# Patient Record
Sex: Female | Born: 1975 | Race: White | Hispanic: Yes | Marital: Married | Smoking: Former smoker
Health system: Southern US, Community
[De-identification: ages and names within clinical notes are randomized; demographics above are authoritative.]

## PROBLEM LIST (undated history)

## (undated) DIAGNOSIS — N920 Excessive and frequent menstruation with regular cycle: Secondary | ICD-10-CM

## (undated) DIAGNOSIS — M069 Rheumatoid arthritis, unspecified: Secondary | ICD-10-CM

## (undated) HISTORY — DX: Rheumatoid arthritis, unspecified: M06.9

## (undated) HISTORY — DX: Excessive and frequent menstruation with regular cycle: N92.0

## (undated) HISTORY — PX: APPENDECTOMY: SHX54

---

## 2010-05-12 ENCOUNTER — Ambulatory Visit (HOSPITAL_COMMUNITY): Admission: RE | Admit: 2010-05-12 | Discharge: 2010-05-12 | Payer: Self-pay | Admitting: Obstetrics and Gynecology

## 2010-11-07 ENCOUNTER — Encounter: Payer: Self-pay | Admitting: Obstetrics and Gynecology

## 2011-07-08 IMAGING — US US OB DETAIL+14 WK
1 series · 18 of 28 positions shown · non-contrast
Comparison: none

OBSTETRICAL ULTRASOUND:
 This ultrasound was performed in The [HOSPITAL], and the [facility] report will be stored to [REDACTED] PACS.  This report is also available in [HOSPITAL]?s accessANYware.

[Series 1: us ob detail+14 wk · 18 of 41 slices shown]
[im 1/41]
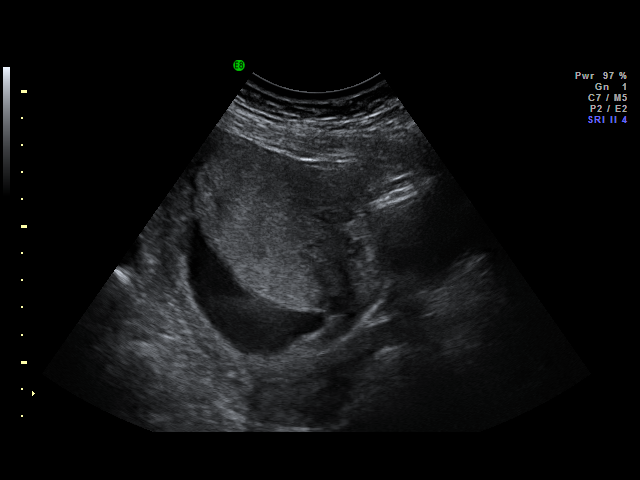
[im 3/41]
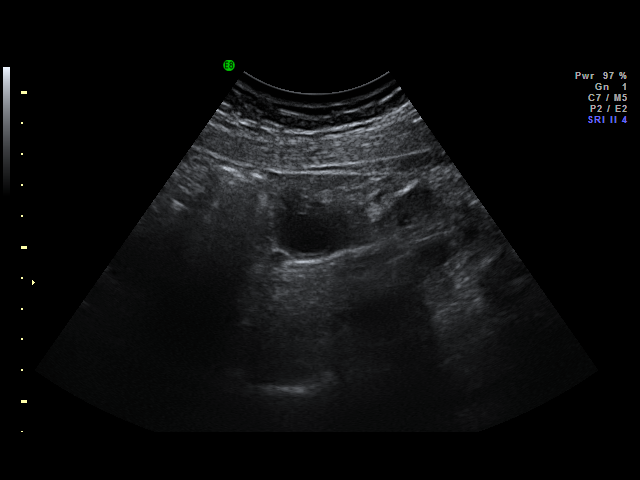
[im 5/41]
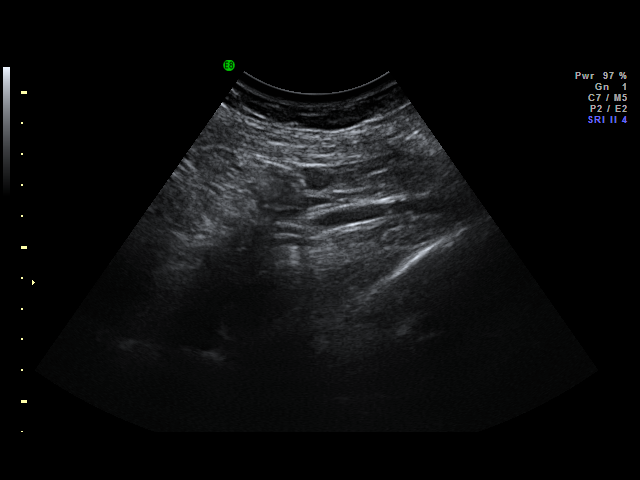
[im 8/41]
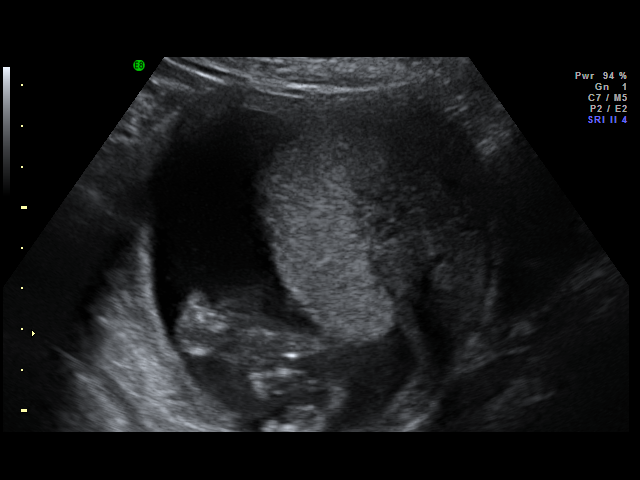
[im 11/41]
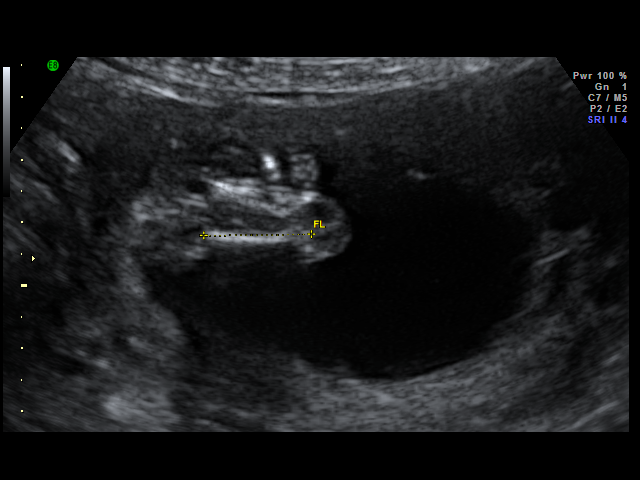
[im 12/41]
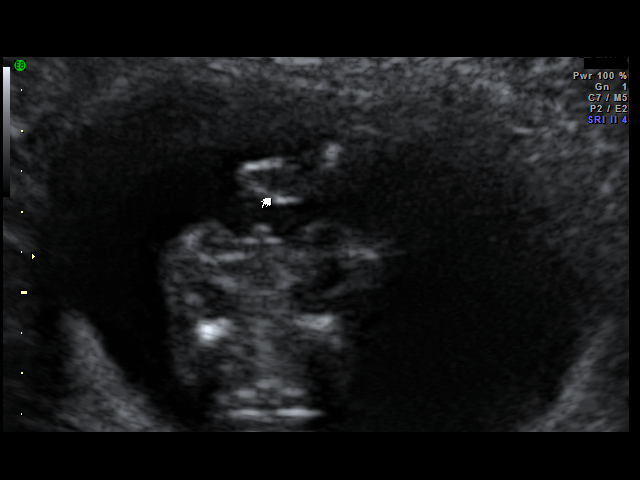
[im 15/41]
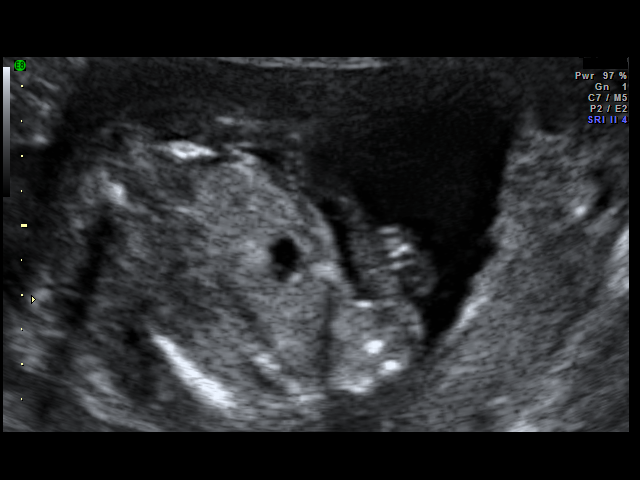
[im 17/41]
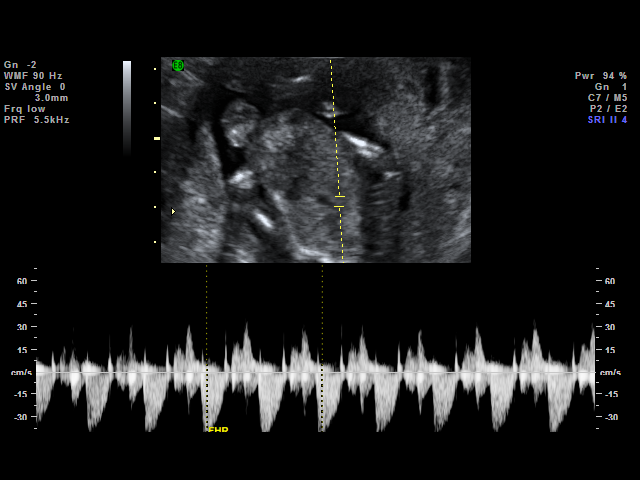
[im 20/41]
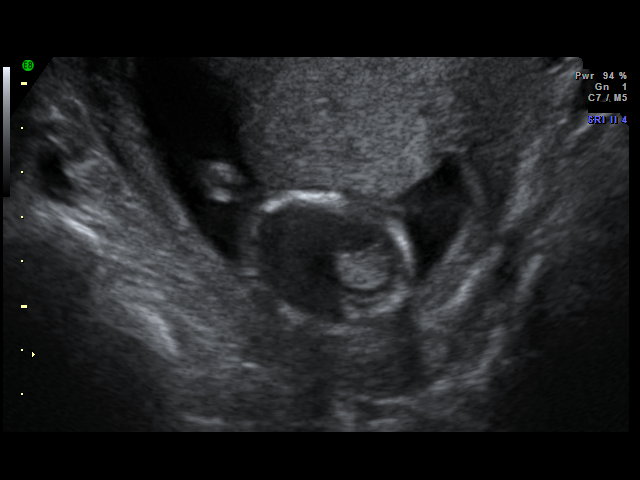
[im 21/41]
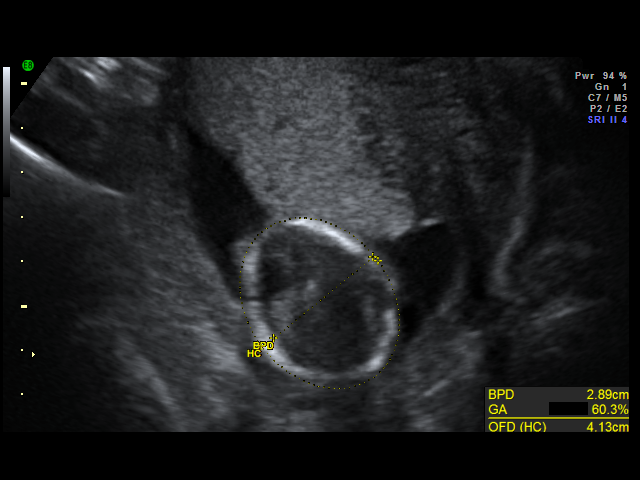
[im 24/41]
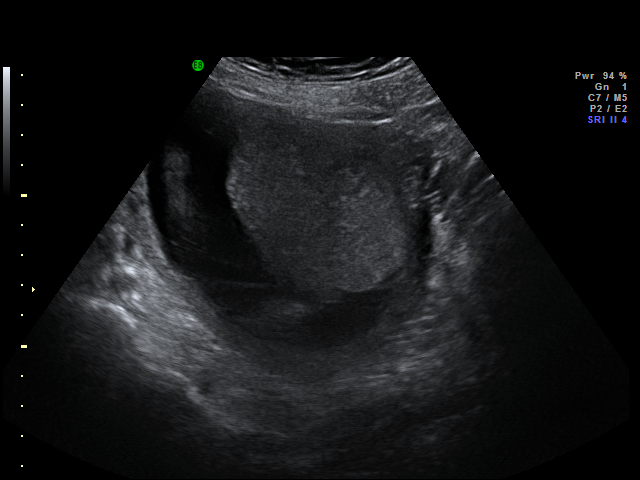
[im 26/41]
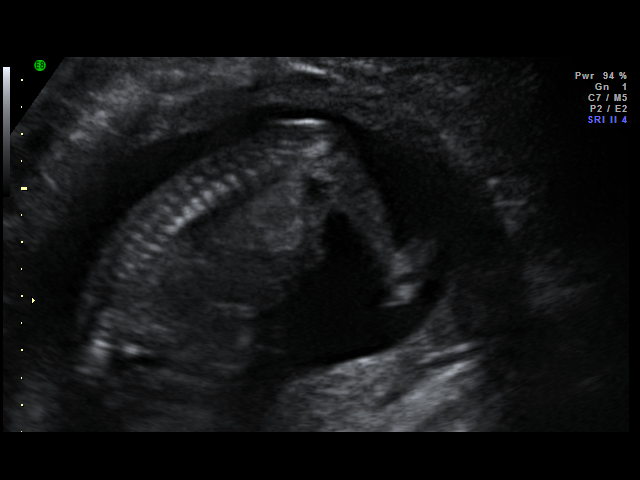
[im 29/41]
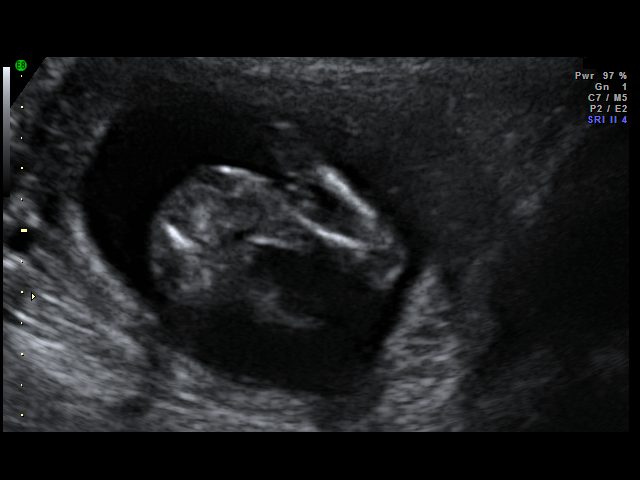
[im 32/41]
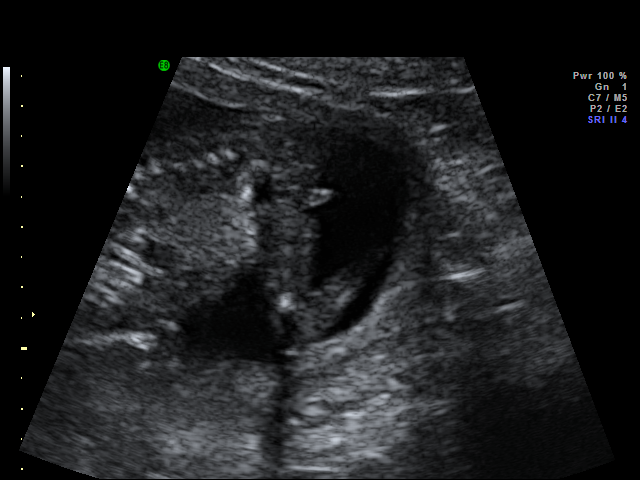
[im 33/41]
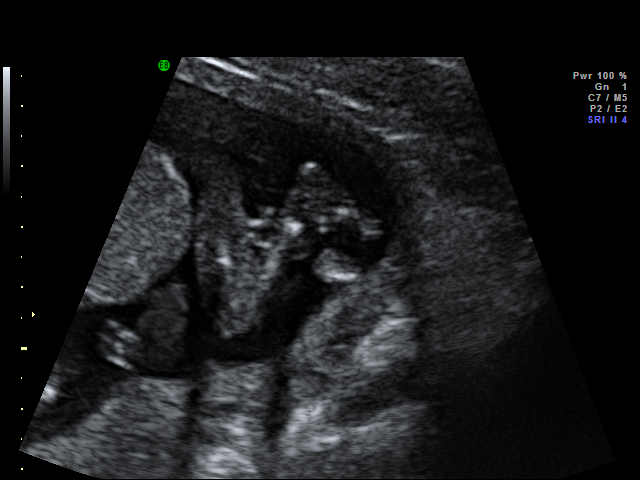
[im 36/41]
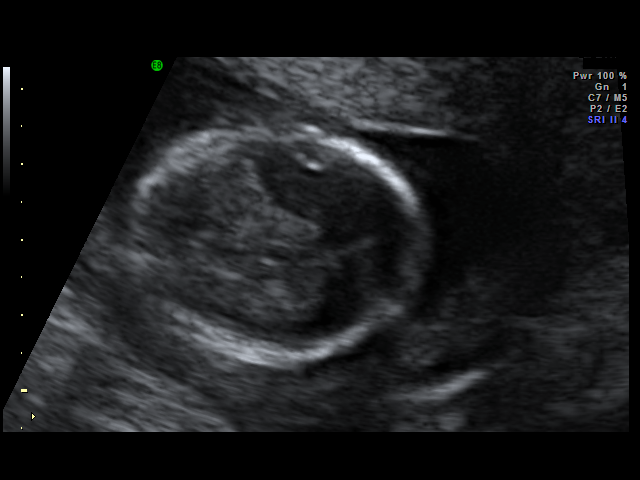
[im 38/41]
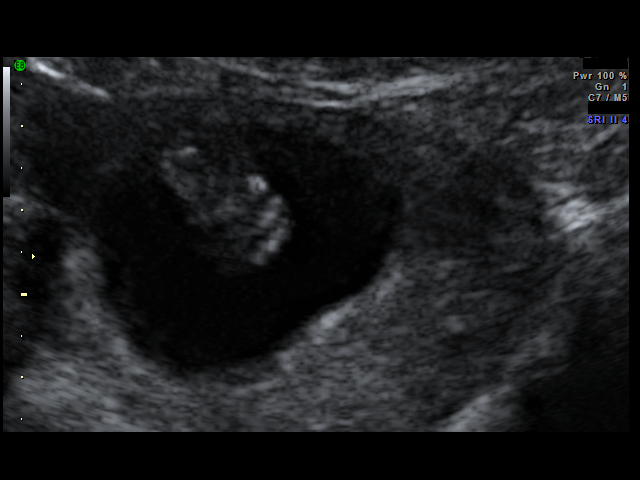
[im 41/41]
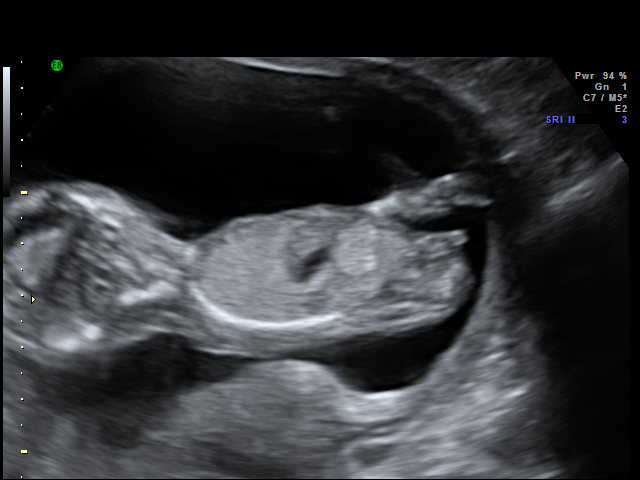

[18 of 28 positions shown; findings below may reference images not displayed]

IMPRESSION: [facility] has also been faxed to the ordering physician.

## 2012-08-29 ENCOUNTER — Ambulatory Visit (INDEPENDENT_AMBULATORY_CARE_PROVIDER_SITE_OTHER): Payer: Self-pay | Admitting: Obstetrics & Gynecology

## 2012-08-29 ENCOUNTER — Encounter: Payer: Self-pay | Admitting: Advanced Practice Midwife

## 2012-08-29 ENCOUNTER — Encounter: Payer: Self-pay | Admitting: Obstetrics & Gynecology

## 2012-08-29 ENCOUNTER — Other Ambulatory Visit (HOSPITAL_COMMUNITY)
Admission: RE | Admit: 2012-08-29 | Discharge: 2012-08-29 | Disposition: A | Discharge status: Home / Self Care | Payer: Self-pay | Source: Ambulatory Visit | Attending: Obstetrics & Gynecology | Admitting: Obstetrics & Gynecology

## 2012-08-29 VITALS — BP 112/78 | HR 88 | Temp 98.0°F | Ht 64.0 in | Wt 178.4 lb

## 2012-08-29 DIAGNOSIS — Z01812 Encounter for preprocedural laboratory examination: Secondary | ICD-10-CM

## 2012-08-29 DIAGNOSIS — N8500 Endometrial hyperplasia, unspecified: Secondary | ICD-10-CM | POA: Insufficient documentation

## 2012-08-29 DIAGNOSIS — M069 Rheumatoid arthritis, unspecified: Secondary | ICD-10-CM | POA: Insufficient documentation

## 2012-08-29 DIAGNOSIS — N920 Excessive and frequent menstruation with regular cycle: Secondary | ICD-10-CM

## 2012-08-29 LAB — POCT PREGNANCY, URINE: Preg Test, Ur: NEGATIVE

## 2012-08-29 MED ORDER — NORGESTIMATE-ETH ESTRADIOL 0.25-35 MG-MCG PO TABS: 1.0000 | ORAL_TABLET | Freq: Every day | ORAL | Status: DC

## 2012-08-29 NOTE — Patient Instructions (Signed)
Dysfunctional Uterine Bleeding Normally, menstrual periods begin between ages 11 to 17 in young women. A normal menstrual cycle/period may begin every 23 days up to 35 days and lasts from 1 to 7 days. Around 12 to 14 days before your menstrual period starts, ovulation (ovary produces an egg) occurs. When counting the time between menstrual periods, count from the first day of bleeding of the previous period to the first day of bleeding of the next period. Dysfunctional (abnormal) uterine bleeding is bleeding that is different from a normal menstrual period. Your periods may come earlier or later than usual. They may be lighter, have blood clots or be heavier. You may have bleeding between periods, or you may skip one period or more. You may have bleeding after sexual intercourse, bleeding after menopause, or no menstrual period. CAUSES   Pregnancy (normal, miscarriage, tubal).  IUDs (intrauterine device, birth control).  Birth control pills.  Hormone treatment.  Menopause.  Infection of the cervix.  Blood clotting problems.  Infection of the inside lining of the uterus.  Endometriosis, inside lining of the uterus growing in the pelvis and other female organs.  Adhesions (scar tissue) inside the uterus.  Obesity or severe weight loss.  Uterine polyps inside the uterus.  Cancer of the vagina, cervix, or uterus.  Ovarian cysts or polycystic ovary syndrome.  Medical problems (diabetes, thyroid disease).  Uterine fibroids (noncancerous tumor).  Problems with your female hormones.  Endometrial hyperplasia, very thick lining and enlarged cells inside of the uterus.  Medicines that interfere with ovulation.  Radiation to the pelvis or abdomen.  Chemotherapy. DIAGNOSIS   Your doctor will discuss the history of your menstrual periods, medicines you are taking, changes in your weight, stress in your life, and any medical problems you may have.  Your doctor will do a physical  and pelvic examination.  Your doctor may want to perform certain tests to make a diagnosis, such as:  Pap test.  Blood tests.  Cultures for infection.  CT scan.  Ultrasound.  Hysteroscopy.  Laparoscopy.  MRI.  Hysterosalpingography.  D and C.  Endometrial biopsy. TREATMENT  Treatment will depend on the cause of the dysfunctional uterine bleeding (DUB). Treatment may include:  Observing your menstrual periods for a couple of months.  Prescribing medicines for medical problems, including:  Antibiotics.  Hormones.  Birth control pills.  Removing an IUD (intrauterine device, birth control).  Surgery:  D and C (scrape and remove tissue from inside the uterus).  Laparoscopy (examine inside the abdomen with a lighted tube).  Uterine ablation (destroy lining of the uterus with electrical current, laser, heat, or freezing).  Hysteroscopy (examine cervix and uterus with a lighted tube).  Hysterectomy (remove the uterus). HOME CARE INSTRUCTIONS   If medicines were prescribed, take exactly as directed. Do not change or switch medicines without consulting your caregiver.  Long term heavy bleeding may result in iron deficiency. Your caregiver may have prescribed iron pills. They help replace the iron that your body lost from heavy bleeding. Take exactly as directed.  Do not take aspirin or medicines that contain aspirin one week before or during your menstrual period. Aspirin may make the bleeding worse.  If you need to change your sanitary pad or tampon more than once every 2 hours, stay in bed with your feet elevated and a cold pack on your lower abdomen. Rest as much as possible, until the bleeding stops or slows down.  Eat well-balanced meals. Eat foods high in iron. Examples   are:  Leafy green vegetables.  Whole-grain breads and cereals.  Eggs.  Meat.  Liver.  Do not try to lose weight until the abnormal bleeding has stopped and your blood iron level is  back to normal. Do not lift more than ten pounds or do strenuous activities when you are bleeding.  For a couple of months, make note on your calendar, marking the start and ending of your period, and the type of bleeding (light, medium, heavy, spotting, clots or missed periods). This is for your caregiver to better evaluate your problem. SEEK MEDICAL CARE IF:   You develop nausea (feeling sick to your stomach) and vomiting, dizziness, or diarrhea while you are taking your medicine.  You are getting lightheaded or weak.  You have any problems that may be related to the medicine you are taking.  You develop pain with your DUB.  You want to remove your IUD.  You want to stop or change your birth control pills or hormones.  You have any type of abnormal bleeding mentioned above.  You are over 71 years old and have not had a menstrual period yet.  You are 36 years old and you are still having menstrual periods.  You have any of the symptoms mentioned above.  You develop a rash. SEEK IMMEDIATE MEDICAL CARE IF:   An oral temperature above 102 F (38.9 C) develops.  You develop chills.  You are changing your sanitary pad or tampon more than once an hour.  You develop abdominal pain.  You pass out or faint. Document Released: 09/30/2000 Document Revised: 12/26/2011 Document Reviewed: 09/01/2009 [facility] Patient Information 2013 [place], [place]. Hemorragia uterina disfuncional (Dysfunctional Uterine Bleeding) Normalmente, los perodos menstruales comienzan en las jvenes de entre 11 y [place]. Un ciclo o perodo menstrual puede repetirse [employer] [address] y los [address] y dura [place] [address] y [address] [employer] [address] y los [address] antes de que comience el ciclo menstrual, se produce la ovulacin (los ovarios producen vulos). Cuando cuente los periodos, hgalo desde Film/video editor del comienzo de la hemorragia del perodo anterior Engineering geologist de la hemorragia del perodo siguiente. La  hemorragia uterina disfuncional (anormal) es diferente del perodo menstrual normal. Los perodos pueden comenzar antes o despus que lo habitual. Pueden ser menos abundantes, presentar cogulos, o ser ms abundantes. Puede tener [employer] perodos o Actor un perodo o ms. Puede tener hemorragia luego de [employer], despus de la menopausia o faltarle el perodo menstrual. CAUSAS  Embarazo (normal, aborto espontneo, embarazo ectpico).  DIU (dispositivo intrauterino, anticonceptivo)  Pldoras anticonceptivas  Tratamiento hormonal  La menopausia  Infeccin en el cervix.  Problemas de coagulacin.  Infecciones de la superficie interna del tero.  Endometriosis: la superficie interna del tero se desarrolla en la pelvis y otros rganos femeninos.  Adherencias (tejido cicatrizal) dentro del tero.  Obesidad o severa prdida de peso.  Plipos en el tero.  Cncer en el crvix, la vagina o el tero.  Quiste de ovarios o Sndrome ovrico poliqustico.  Otras enfermedades (diabetes, enfermedad de la tiroides, Catering manager).  Fibromas en el tero (tumores no cancerosos).  Problemas con las hormonas femeninas.  Hiperplasia del endometrio: capa gruesa y clulas agrandadas dentro del tero.  Medicamentos que interfieren con la ovulacin.  Radiacin en la pelvis o el abdomen.  Quimioterapia. DIAGNSTICO  El mdico Radio broadcast assistant historia de sus perodos [place], los medicamentos que toma, los cambios en el peso corporal, [place]  estrs de la vida diaria y cualquier problema mdico que tenga.  El mdico realizar un examen fsico, incluyendo un examen plvico.  Tambin le solicitar anlisis para completar el diagnstico. Ellos son:  Papanicolau.  Anlisis de [place].  Cultivos para descartar infecciones.  Tomografa computada.  Ultrasonido.  Histeroscopa.  Laparoscopa.  Resonancia magntica.  Histerosalpingografa.  Dilatacin y  curetaje.  Biopsia de endometrio. TRATAMIENTO El tratamiento depender de la causa de la hemorragia.. El tratamiento consiste en:  Observacin de los perodos menstruales durante algunos meses.  Prescripcin de medicamentos como:  Antibiticos:  Hormonas.  Pldoras anticonceptivas  Retirar el DIU (dispositivo intrauterino, anticonceptivo).  Ciruga:  Dilatacin y curetaje (raspado y remocin de tejido de la zona interna del tero).  Laparoscopa (examen del interior del abdomen con un tubo con luz).  Ablacin uterina (destruccin de la membrana que cubre el tero con corriente elctrica, rayos lser, congelamiento o calor ).  Histeroscopa (examen del cuello y el tero con un tubo con luz).  Histerectoma (extirpacin del tero). INSTRUCCIONES PARA EL CUIDADO DOMICILIARIO  Si el profesional que la asiste le prescribe medicamentos, tmelos tal como se le indic. No cambie ni reemplace medicamentos sin consultarlo con Mining engineer.  Las hemorragias de larga duracin pueden traen como consecuencia un dficit de hierro. El profesional que lo asiste podr Pharmacist, hospital. Esto ayuda a Scientific laboratory technician que el organismo pierde luego de una hemorragia abundante. Tome los medicamentos tal como se le indic.  No tome aspirina o medicamentos que la contengan u desde una semana antes del perodo menstrual ni durante el mismo. La aspirina puede hacer que la hemorragia empeore.  Si necesita cambiar el apsito o el tampn mas de una vez cada 2 horas, permanezca en cama con los pies elevados y aplique una compresa fra en la zona baja del abdomen. Descanse todo lo que pueda hasta que la hemorragia se detenga.  Consuma alimentos balanceados. Coma alimentos ricos en hierro. Por ejemplo:  Vegetales verdes frescos.  Cereales integrales y cereales y panes con salvado.  Huevos.  Carnes.  Hgado.  No trate de perder peso hasta que la hemorragia anormal se detenga y los niveles de  hierro en la sangre vuelvan a la normalidad. No levante pesos de ms de 10 libras (5 kg.) ni realice actividades extenuantes mientras tenga la hemorragia.  Durante un par de meses tome nota en un almanaque y [employer] comienzo y el fin de su perodo menstrual y el tipo de sangrado (escaso, [place], [place], gotas, cogulos o falta de perodo). El objetivo es que su mdico evale mejor el problema. SOLICITE ATENCIN MDICA SI:  Debe cambiar el apsito o el tampn ms de una vez cada hora.  Se siente mareada o dbil.  Tiene algn problema que pueda relacionarse con el medicamento que est tomando.  Siente dolor.  Desea retirar su DIU.  Quiere suspender o cambiar sus pldoras anticonceptivas u hormonas.  Tiene algn tipo de hemorragia anormal mencionada anteriormente.  Tiene ms de 50 aos y an no ha tenido su perodo menstrual.  Tiene 44 aos y an tiene Environmental manager.  Tiene alguno de los sntomas mencionados anteriormente.  Aparece una erupcin cutnea. SOLICITE ATENCIN MDICA DE INMEDIATO SI:  La temperatura oral se eleva sin motivo por encima de 102 F (38.9 C).  Comienza a sentir escalofros.  Debe cambiar el apsito o el tampn ms de una vez cada hora.  Siente dolor abdominal.  Pierde el conocimiento, se desmaya. Document Released: 07/13/2005 Document  Revised: 12/26/2011 [employer] Patient Information 2013 [place], [place].

## 2012-08-29 NOTE — Progress Notes (Signed)
Patient ID: Pamela Mcfarland, female   DOB: 05-24-1976, 36 y.o.   MRN: [id number] Pt reports a h/o menorrhagia for 2 months.  Pt reports that she did not have bleeding for 6 months than the bleeding returned and has not stopped.  Prior to the 6 months of amenorrhea, pt was having normal 28 day cycles.  She reports that the amenorrhea was assosciated with mood changes but, not hot flashes.  Pt was seen in the ER at [facility] and told that she had an 'mass'  She is not sure where.  The indications for endometrial biopsy were reviewed.   Risks of the biopsy including cramping, bleeding, infection, uterine perforation, inadequate specimen and need for additional procedures  were discussed. The patient states she understands and agrees to undergo procedure today. Consent was signed. Time out was performed. Urine HCG was negative. A sterile speculum was placed in the patient's vagina and the cervix was prepped with Betadine. A single-toothed tenaculum was placed on the anterior lip of the cervix to stabilize it. The 3 mm pipelle was introduced into the endometrial cavity without difficulty to a depth of 8cm, and a moderate amount of tissue was obtained and sent to pathology. The instruments were removed from the patient's vagina. Minimal bleeding from the cervix was noted. The patient tolerated the procedure well. Routine post-procedure instructions were given to the patient. The patient will follow up to review the results and for further management.    Records requested from [facility] F/u in 2weeks to review records, and bx Sprintec 1 po q day  Corben Auzenne L. Harraway-Smith, M.D., [place]

## 2012-09-03 ENCOUNTER — Telehealth: Payer: Self-pay | Admitting: *Deleted

## 2012-09-03 NOTE — Telephone Encounter (Signed)
Pt left message stating she was expecting a Rx to be sent to her pharmacy and they do not have the information. Please call back.  I called [facility] and verified that the RX- Sprintec was received and ready for pick up.

## 2012-09-04 NOTE — Telephone Encounter (Signed)
LM for patient with Pamela Mcfarland that results were normal and patient should take pills as prescribed and the pills are available for her at the pharmacy now.

## 2012-09-04 NOTE — Telephone Encounter (Signed)
Note from Dr. Erin Fulling Please call pt. Notify of normal Endobx. rec that she continue to take the Sprintec.

## 2012-09-17 ENCOUNTER — Ambulatory Visit: Payer: Self-pay | Admitting: Obstetrics & Gynecology

## 2012-10-05 ENCOUNTER — Encounter: Payer: Self-pay | Admitting: Advanced Practice Midwife

## 2012-10-05 ENCOUNTER — Ambulatory Visit (INDEPENDENT_AMBULATORY_CARE_PROVIDER_SITE_OTHER): Payer: Self-pay | Admitting: Advanced Practice Midwife

## 2012-10-05 VITALS — BP 100/68 | HR 76 | Temp 97.2°F | Ht 64.0 in | Wt 177.1 lb

## 2012-10-05 DIAGNOSIS — N83209 Unspecified ovarian cyst, unspecified side: Secondary | ICD-10-CM

## 2012-10-05 DIAGNOSIS — R102 Pelvic and perineal pain: Secondary | ICD-10-CM

## 2012-10-05 DIAGNOSIS — N920 Excessive and frequent menstruation with regular cycle: Secondary | ICD-10-CM

## 2012-10-05 DIAGNOSIS — L293 Anogenital pruritus, unspecified: Secondary | ICD-10-CM

## 2012-10-05 DIAGNOSIS — A499 Bacterial infection, unspecified: Secondary | ICD-10-CM

## 2012-10-05 DIAGNOSIS — B9689 Other specified bacterial agents as the cause of diseases classified elsewhere: Secondary | ICD-10-CM

## 2012-10-05 DIAGNOSIS — N898 Other specified noninflammatory disorders of vagina: Secondary | ICD-10-CM

## 2012-10-05 DIAGNOSIS — N760 Acute vaginitis: Secondary | ICD-10-CM

## 2012-10-05 MED ORDER — IBUPROFEN 600 MG PO TABS: 600.0000 mg | ORAL_TABLET | Freq: Four times a day (QID) | ORAL | Status: DC | PRN

## 2012-10-05 MED ORDER — TERCONAZOLE 0.4 % VA CREA: 1.0000 | TOPICAL_CREAM | Freq: Every day | VAGINAL | Status: DC

## 2012-10-05 NOTE — Progress Notes (Signed)
  Subjective:    Patient ID: Pamela Mcfarland, female    DOB: 01-03-1976, 36 y.o.   MRN: [id number]  HPI This is a 36 y.o. female who presents to review results today. Her endometrial biopsy was done for menorrhagia.  She had a normal [place] in [facility] but states she was told at some point she had a mass.   Previous MD Note: Patient ID: Pamela Mcfarland, female DOB: 04/23/1976, 36 y.o. MRN: [id number]  Pt reports a h/o menorrhagia for 2 months. Pt reports that she did not have bleeding for 6 months than the bleeding returned and has not stopped. Prior to the 6 months of amenorrhea, pt was having normal 28 day cycles. She reports that the amenorrhea was assosciated with mood changes but, not hot flashes.  Pt was seen in the ER at [facility] and told that she had an 'mass' She is not sure where.  The indications for endometrial biopsy were reviewed. ...... The patient states she understands and agrees to undergo procedure today. Consent was signed. Time out was performed. Urine HCG was negative.   The patient will follow up to review the results and for further management.  Records requested from [facility]  F/u in 2weeks to review records, and bx  Sprintec 1 po q day  Carolyn L. Harraway-Smith, M.D., FACOG   Review of Systems Negative except for RLQ pain and vaginal itching    Objective:   Physical Exam  Constitutional: She is oriented to person, place, and time. She appears well-developed and well-nourished. No distress.  Neurological: She is alert and oriented to person, place, and time.  Skin: Skin is warm and dry.  Psychiatric: She has a normal mood and affect.    [place] in [place] on 08/27/12 showed a 24mm fibroid, a complex cyst on Left ovary, and several cysts on Right ovary. Previously seen R cyst was resolved.  Endometrium was thickened.      Assessment & Plan:  A:  RLQ pain       Normal [place] in [place] in November  P:  Discussed  with Dr Erin Fulling       She  suggests we Repeat US in 3 mos       Wet prep       Rx Terazol and ibuprofen

## 2012-10-05 NOTE — Patient Instructions (Addendum)
Abdominal Pain, Women Abdominal (stomach, pelvic, or belly) pain can be caused by many things. It is important to tell your doctor:  The location of the pain.  Does it come and go or is it present all the time?  Are there things that start the pain (eating certain foods, exercise)?  Are there other symptoms associated with the pain (fever, nausea, vomiting, diarrhea)? All of this is helpful to know when trying to find the cause of the pain. CAUSES   Stomach: virus or bacteria infection, or ulcer.  Intestine: appendicitis (inflamed appendix), regional ileitis (Crohn's disease), ulcerative colitis (inflamed colon), irritable bowel syndrome, diverticulitis (inflamed diverticulum of the colon), or cancer of the stomach or intestine.  Gallbladder disease or stones in the gallbladder.  Kidney disease, kidney stones, or infection.  Pancreas infection or cancer.  Fibromyalgia (pain disorder).  Diseases of the female organs:  Uterus: fibroid (non-cancerous) tumors or infection.  Fallopian tubes: infection or tubal pregnancy.  Ovary: cysts or tumors.  Pelvic adhesions (scar tissue).  Endometriosis (uterus lining tissue growing in the pelvis and on the pelvic organs).  Pelvic congestion syndrome (female organs filling up with blood just before the menstrual period).  Pain with the menstrual period.  Pain with ovulation (producing an egg).  Pain with an IUD (intrauterine device, birth control) in the uterus.  Cancer of the female organs.  Functional pain (pain not caused by a disease, may improve without treatment).  Psychological pain.  Depression. DIAGNOSIS  Your doctor will decide the seriousness of your pain by doing an examination.  Blood tests.  X-rays.  Ultrasound.  CT scan (computed tomography, special type of X-ray).  MRI (magnetic resonance imaging).  Cultures, for infection.  Barium enema (dye inserted in the large intestine, to better view it with  X-rays).  Colonoscopy (looking in intestine with a lighted tube).  Laparoscopy (minor surgery, looking in abdomen with a lighted tube).  Major abdominal exploratory surgery (looking in abdomen with a large incision). TREATMENT  The treatment will depend on the cause of the pain.   Many cases can be observed and treated at home.  Over-the-counter medicines recommended by your caregiver.  Prescription medicine.  Antibiotics, for infection.  Birth control pills, for painful periods or for ovulation pain.  Hormone treatment, for endometriosis.  Nerve blocking injections.  Physical therapy.  Antidepressants.  Counseling with a psychologist or psychiatrist.  Minor or major surgery. HOME CARE INSTRUCTIONS   Do not take laxatives, unless directed by your caregiver.  Take over-the-counter pain medicine only if ordered by your caregiver. Do not take aspirin because it can cause an upset stomach or bleeding.  Try a clear liquid diet (broth or water) as ordered by your caregiver. Slowly move to a bland diet, as tolerated, if the pain is related to the stomach or intestine.  Have a thermometer and take your temperature several times a day, and record it.  Bed rest and sleep, if it helps the pain.  Avoid sexual intercourse, if it causes pain.  Avoid stressful situations.  Keep your follow-up appointments and tests, as your caregiver orders.  If the pain does not go away with medicine or surgery, you may try:  Acupuncture.  Relaxation exercises (yoga, meditation).  Group therapy.  Counseling. SEEK MEDICAL CARE IF:   You notice certain foods cause stomach pain.  Your home care treatment is not helping your pain.  You need stronger pain medicine.  You want your IUD removed.  You feel faint or  lightheaded.  You develop nausea and vomiting.  You develop a rash.  You are having side effects or an allergy to your medicine. SEEK IMMEDIATE MEDICAL CARE IF:   Your  pain does not go away or gets worse.  You have a fever.  Your pain is felt only in portions of the abdomen. The right side could possibly be appendicitis. The left lower portion of the abdomen could be colitis or diverticulitis.  You are passing blood in your stools (bright red or black tarry stools, with or without vomiting).  You have blood in your urine.  You develop chills, with or without a fever.  You pass out. MAKE SURE YOU:   Understand these instructions.  Will watch your condition.  Will get help right away if you are not doing well or get worse. Document Released: 07/31/2007 Document Revised: 12/26/2011 Document Reviewed: 08/20/2009 [facility] Patient Information 2013 [place], [place]. Vaginitis monilisica (Monilial Vaginitis) La vaginitis es una inflamacin (irritacin, hinchazn) de la vagina y la vulva. Esta no es una enfermedad de transmisin sexual.  CAUSAS Este tipo de vaginitis lo causa un hongo (candida) que normalmente se encuentra en la vagina. El hongo candida se ha desarrollado hasta el punto de ocasionar problemas en el equilibrio qumico. SNTOMAS  Secrecin vaginal espesa y blanca.  Hinchazn, picazn, enrojecimiento e inflamacin de la vagina y en algunos casos de los labios vaginales (vulva).  Ardor o dolor al [employer].  Dolor en las relaciones sexuales. DIAGNSTICO Los factores que favorecen la vaginitis moniliasica son:  Everlean Patterson de virginidad y postmenopusicas.  Embarazo.  Infecciones.  Sentir cansancio, estar enferma o estresada, especialmente si ya ha sufrido este problema en el pasado.  Diabetes Buen control ayudar a disminur la probabilidad.  Pldoras anticonceptivas  Ropa interior [place].  El uso de espumas de bao, aerosoles femeninos duchas vaginales o tampones con desodorante.  Algunos antibiticos (medicamentos que destruyen grmenes).  Si contrae alguna enfermedad puede sufrir recurrencias  espordicas. TRATAMIENTO El profesional que lo asiste prescribir medicamentos.  Hay diferentes tipos de cremas y supositorios vaginales que tratan especficamente la vaginitis monilisica. Para infecciones por hongos recurrentes, utilice un supositorio o crema en la vagina dos veces por semana, o segn se le indique.  Tambin podrn utilizarse cremas con corticoides o anti monilisicas para la picazn o la irritacin de la vulva. Consulte con el profesional que la asiste.  Si la crema no da resultado, podr aplicarse en la vagina una solucin con azul de metileno.  El consumo de yogur puede prevenir este tipo de vaginitis. INSTRUCCIONES PARA EL CUIDADO DOMICILIARIO  Tome todos los medicamentos tal como se le indic.  No mantenga relaciones sexuales hasta que el tratamiento se haya completado, o segn las indicaciones del profesional que la asiste.  Tome baos de asiento tibios.  No se aplique duchas vaginales.  No utilice tampones, especialmente los perfumados.  Use ropa interior de algodn  Mirant pantalones ajustados y las medias tipo panty.  Comunique a sus compaeros sexuales que sufre una infeccin por hongos. Ellos deben concurrir para un control mdico si tienen sntomas como una urticaria leve o picazn.  Sus compaeros sexuales deben tratarse tambin si la infeccin es difcil de Pharmacologist.  Practique el sexo seguro  use condones  Algunos medicamentos vaginales ocasionan fallas en los condones de ltex. Los medicamentos vaginales que pueden daar los condones son:  Chiropodist cleocina  Butoconazole Therapist, sports)  Terconazole (Terazol) supositorios vaginales  Miconazole (Monistat) (es un medicamento de Sales promotion account executive) Bieber  ATENCIN MDICA SI:  Usted tiene una temperatura oral de ms de 102 F (38.9 C).  Si la infeccin empeora luego de 2 [address]  Si la infeccin no mejora luego de 3 [address].  Aparecen ampollas en o alrededor de la  vagina.  Si aparece una hemorragia vaginal y no es el momento del perodo.  Siente dolor al [employer].  Presenta problemas intestinales.  Tiene dolor durante las [employer]. Document Released: 07/13/2005 Document Revised: 12/26/2011 [facility] Patient Information 2013 [place], [place].

## 2012-10-07 DIAGNOSIS — N83209 Unspecified ovarian cyst, unspecified side: Secondary | ICD-10-CM | POA: Insufficient documentation

## 2012-10-07 DIAGNOSIS — N920 Excessive and frequent menstruation with regular cycle: Secondary | ICD-10-CM | POA: Insufficient documentation

## 2012-10-12 ENCOUNTER — Telehealth: Payer: Self-pay | Admitting: *Deleted

## 2012-10-12 MED ORDER — METRONIDAZOLE 500 MG PO TABS: 500.0000 mg | ORAL_TABLET | Freq: Two times a day (BID) | ORAL | Status: DC

## 2012-10-12 NOTE — Telephone Encounter (Signed)
Called pt with Spanish interpreter Pamela Mcfarland and informed pt that she has BV and that antibiotics have been prescribed for her.  I verified pharmacy with her.  Pt stated understanding.  Pt stated that she is continuing to bleed and that she taking the placebos at this time.  I informed pt that it sounds like she is currently on period.  Pt stated that she has never had this type of bleeding where she is bleeding through her clothes.  I informed pt that she just started taking the BCP so her bleeding.  If she could please take one more cycle of pills to see if she see changes in her period.  Pt stated understanding and did not have any other questions.

## 2012-10-12 NOTE — Addendum Note (Signed)
Addended by: Aviva Signs on: 10/12/2012 07:09 AM   Modules accepted: Orders

## 2012-10-12 NOTE — Telephone Encounter (Signed)
Message copied by Mannie Stabile on Fri Oct 12, 2012 10:26 AM ------      Message from: [place], [place]      Created: Fri Oct 12, 2012  7:07 AM      Regarding: BV treatment       Treated for yeast, but patient actually had BV            Need to notify her of med ordered.            Thanks

## 2012-10-12 NOTE — Telephone Encounter (Signed)
Message copied by Faythe Casa on Fri Oct 12, 2012 10:54 AM ------      Message from: Aviva Signs      Created: Fri Oct 12, 2012  7:07 AM      Regarding: BV treatment       Treated for yeast, but patient actually had BV            Need to notify her of med ordered.            Thanks

## 2012-12-21 ENCOUNTER — Ambulatory Visit (HOSPITAL_COMMUNITY): Payer: No Typology Code available for payment source

## 2013-11-15 ENCOUNTER — Encounter: Payer: Self-pay | Admitting: Family Medicine

## 2013-11-15 ENCOUNTER — Ambulatory Visit: Payer: Self-pay | Admitting: Nurse Practitioner

## 2013-11-15 ENCOUNTER — Ambulatory Visit (INDEPENDENT_AMBULATORY_CARE_PROVIDER_SITE_OTHER): Payer: Self-pay | Admitting: Family Medicine

## 2013-11-15 VITALS — BP 104/74 | HR 81 | Temp 98.7°F | Ht 64.0 in | Wt 183.4 lb

## 2013-11-15 DIAGNOSIS — Z01812 Encounter for preprocedural laboratory examination: Secondary | ICD-10-CM

## 2013-11-15 DIAGNOSIS — N920 Excessive and frequent menstruation with regular cycle: Secondary | ICD-10-CM

## 2013-11-15 DIAGNOSIS — N898 Other specified noninflammatory disorders of vagina: Secondary | ICD-10-CM

## 2013-11-15 LAB — CBC
HEMATOCRIT: 35.2 % — AB (ref 36.0–46.0)
Hemoglobin: 12 g/dL (ref 12.0–15.0)
MCH: 30.3 pg (ref 26.0–34.0)
MCHC: 34.1 g/dL (ref 30.0–36.0)
MCV: 88.9 fL (ref 78.0–100.0)
PLATELETS: 245 10*3/uL (ref 150–400)
RBC: 3.96 MIL/uL (ref 3.87–5.11)
RDW: 13.9 % (ref 11.5–15.5)
WBC: 9.4 10*3/uL (ref 4.0–10.5)

## 2013-11-15 LAB — PROLACTIN: PROLACTIN: 13.7 ng/mL

## 2013-11-15 LAB — HEMOGLOBIN A1C
HEMOGLOBIN A1C: 5.4 % (ref <5.7)
Mean Plasma Glucose: 108 mg/dL (ref <117)

## 2013-11-15 LAB — TSH: TSH: 2.901 u[IU]/mL (ref 0.350–4.500)

## 2013-11-15 LAB — POCT PREGNANCY, URINE: Preg Test, Ur: NEGATIVE

## 2013-11-15 NOTE — Progress Notes (Signed)
Patient ID: Pamela Mcfarland, female   DOB: [birth year], 38 y.o.   MRN: [id number]  Pamela Mcfarland is a 38 y.o. [id number] presented to GYN clinic c/o heavy vaginal bleeding for over one year with associated abdominal cramping.  Last period started in November, lasted approx 3 months, and stopped 3 days ago.  Some 2-3 day long breaks.  5-6 pads per day.  Seen at [facility] for repeat US.  Endometrial biopsy performed in November 2013 was normal.  Also c/o vaginal discharge (white, yellow) and itching x 3 days.  Tried IUD in past.  On oral contraceptives currently.   Translator required.  Past Medical History  Diagnosis Date  . RA (rheumatoid arthritis)   . Menorrhagia    Current Outpatient Prescriptions on File Prior to Visit  Medication Sig Dispense Refill  . folic acid (FOLVITE) 338 MCG tablet Take 400 mcg by mouth daily.      Marland Kitchen ibuprofen (ADVIL,MOTRIN) 600 MG tablet Take 1 tablet (600 mg total) by mouth every 6 (six) hours as needed for pain.  30 tablet  1  . norgestimate-ethinyl estradiol (ORTHO-CYCLEN,SPRINTEC,PREVIFEM) 0.25-35 MG-MCG tablet Take 1 tablet by mouth daily.  1 Package  11   No current facility-administered medications on file prior to visit.   Allergies  Allergen Reactions  . Penicillins Nausea Only and Other (See Comments)    "made me pass out", dizzy, weakness   ROS Negative unless otherwise specified in HPI.  PHYSICAL EXAM General:  WDWN female in NAD. HEENT:  Normocephalic, atraumatic.  Moist mucous membranes. CVS:  S1 and S2 distinct. Lungs:  Minimal rhochi RUL, CTAB remaining Abdomen:  Soft, non-distended.  Mild tenderness in lower pelvic region. Pelvic:  Normal external genitalia.  No visible lesions.  Small amount of white discharge present in vaginal canal.  Uterus is tender.  Adnexa is non-tender. Skin:  Warm, dry.  No rashes. No hirsutism.   Neuro:  A&Ox3 Psych:  Normal affect and mood. Good judgement.  ASSESSMENT &  PLAN #Metromenorrhagia - CBC, TSH, Prolactin, A1C - Received records (see media tab); US showed left ovarian complex cyst and uterine fibroids - RTC in 2 weeks to evaluate for ablation  #Vaginal Discharge - Wet Prep, GC/Chlamydia  Cleatus Goodin L 11/15/2013 11:49 AM  I spoke with and examined patient and agree with PA-S's note and plan of care.  Fredrik Rigger, MD Ob Fellow 11/15/2013 12:08 PM

## 2013-11-15 NOTE — Progress Notes (Signed)
Here for menorrhagia.  Has been having periods that last weeks at at time and stop a few days and then start again.  And are very heavy and having pelvic pain over a year. States was told had cancer but then was told she didn't. Was seen in [place] 3 months ago because of the bleeding, , and they did an ultrasound and found a cyst and a myoma.They told her to come here to decide if she needed  Surgery. Also c/o vaginal itching and discharge- yellow or white. Also her RA doctor told her she is anemic right now.  States she is anxious to get this straightened out. Taking iron.

## 2013-11-15 NOTE — Patient Instructions (Signed)
Metrorrhagia   Metrorrhagia is uterine bleeding at irregular intervals, especially between menstrual periods.   CAUSES    Dysfunctional uterine bleeding.   Uterine lining growing outside the uterus (endometriosis).   Embryo adhering to uterine wall (implantation).   Pregnancy growing in the fallopian tubes (ectopic pregnancy).   Miscarriage.   Menopause.   Cancer of the reproduction organs.   Certain drugs such as hormonal contraceptives.   Inherited bleeding disorders.   Trauma.   Uterine fibroids.   Sexually transmitted diseases (STDs).   Polycystic ovarian disease.  DIAGNOSIS   A history will be taken.   A physical exam will be performed.   Other tests may include:   Blood tests.   A pregnancy test.   An ultrasound of the abdomen and pelvis.   A biopsy of the uterine lining.   AMRI or CT scan of the abdomen and pelvis.  TREATMENT  Treatment will depend on the cause.  HOME CARE INSTRUCTIONS    Take all medicines as directed by your caregiver. Do not change or switch medicines without talking to your caregiver.   Take all iron supplements exactly as directed by your caregiver. Iron supplements help to replace the iron your body loses from irregular bleeding.If you become constipated, increase the amount of fiber, fruits, and vegetables in your diet.   Do not take aspirin or medicines that contain aspirin for 1 week before your menstrual period or during your menstrual period. Aspirin may increase the bleeding.   Rest as much as possible if you change your sanitary pad or tampon more than once every 2 hours.   Eat well-balanced meals including foods high in iron, such as green leafy vegetables, red meat, liver, eggs, and whole-grain breads and cereals.   Do not try to lose weight until the abnormal bleeding is controlled and your blood iron level is back to normal.  SEEK MEDICAL CARE IF:    You have nausea and vomiting, or you cannot keep foods down.   You feel dizzy or have diarrhea  while taking medicine.   You have any problems that may be related to the medicine you are taking.  SEEK IMMEDIATE MEDICAL CARE IF:    You have a fever.   You develop chills.   You become lightheaded or faint.   You need to change your sanitary pad or tampon more than once an hour.   Your bleeding becomesheavy.   You begin to pass clots or tissue.  MAKE SURE YOU:    Understand these instructions.   Will watch your condition.   Will get help right away if you are not doing well or get worse.  Document Released: 10/03/2005 Document Revised: 12/26/2011 Document Reviewed: 05/02/2011  [employer] Patient Information 2014 [employer].

## 2013-11-16 LAB — GC/CHLAMYDIA PROBE AMP
CT Probe RNA: NEGATIVE
GC Probe RNA: NEGATIVE

## 2013-11-16 LAB — WET PREP, GENITAL
CLUE CELLS WET PREP: NONE SEEN
Trich, Wet Prep: NONE SEEN
WBC, Wet Prep HPF POC: NONE SEEN
Yeast Wet Prep HPF POC: NONE SEEN

## 2013-11-25 ENCOUNTER — Encounter: Payer: Self-pay | Admitting: *Deleted

## 2013-12-02 ENCOUNTER — Ambulatory Visit: Payer: Self-pay | Admitting: Obstetrics & Gynecology

## 2013-12-06 ENCOUNTER — Ambulatory Visit: Payer: Self-pay | Admitting: Obstetrics & Gynecology

## 2013-12-20 ENCOUNTER — Ambulatory Visit (INDEPENDENT_AMBULATORY_CARE_PROVIDER_SITE_OTHER): Payer: No Typology Code available for payment source | Admitting: Obstetrics & Gynecology

## 2013-12-20 ENCOUNTER — Encounter: Payer: Self-pay | Admitting: Obstetrics & Gynecology

## 2013-12-20 ENCOUNTER — Other Ambulatory Visit (HOSPITAL_COMMUNITY): Payer: Self-pay | Admitting: Obstetrics and Gynecology

## 2013-12-20 ENCOUNTER — Other Ambulatory Visit (HOSPITAL_COMMUNITY)
Admission: RE | Admit: 2013-12-20 | Discharge: 2013-12-20 | Disposition: A | Discharge status: Home / Self Care | Payer: No Typology Code available for payment source | Source: Ambulatory Visit | Attending: Obstetrics & Gynecology | Admitting: Obstetrics & Gynecology

## 2013-12-20 VITALS — BP 112/84 | HR 76 | Temp 98.0°F | Wt 180.2 lb

## 2013-12-20 DIAGNOSIS — D259 Leiomyoma of uterus, unspecified: Secondary | ICD-10-CM

## 2013-12-20 DIAGNOSIS — B3731 Acute candidiasis of vulva and vagina: Secondary | ICD-10-CM

## 2013-12-20 DIAGNOSIS — Z789 Other specified health status: Secondary | ICD-10-CM

## 2013-12-20 DIAGNOSIS — N920 Excessive and frequent menstruation with regular cycle: Secondary | ICD-10-CM

## 2013-12-20 DIAGNOSIS — Z609 Problem related to social environment, unspecified: Secondary | ICD-10-CM

## 2013-12-20 DIAGNOSIS — D219 Benign neoplasm of connective and other soft tissue, unspecified: Secondary | ICD-10-CM | POA: Insufficient documentation

## 2013-12-20 DIAGNOSIS — R300 Dysuria: Secondary | ICD-10-CM

## 2013-12-20 DIAGNOSIS — IMO0002 Reserved for concepts with insufficient information to code with codable children: Secondary | ICD-10-CM

## 2013-12-20 DIAGNOSIS — N899 Noninflammatory disorder of vagina, unspecified: Secondary | ICD-10-CM

## 2013-12-20 DIAGNOSIS — N898 Other specified noninflammatory disorders of vagina: Secondary | ICD-10-CM

## 2013-12-20 DIAGNOSIS — Z01419 Encounter for gynecological examination (general) (routine) without abnormal findings: Secondary | ICD-10-CM

## 2013-12-20 DIAGNOSIS — B373 Candidiasis of vulva and vagina: Secondary | ICD-10-CM

## 2013-12-20 DIAGNOSIS — Z603 Acculturation difficulty: Secondary | ICD-10-CM

## 2013-12-20 DIAGNOSIS — N83209 Unspecified ovarian cyst, unspecified side: Secondary | ICD-10-CM

## 2013-12-20 LAB — POCT URINALYSIS DIP (DEVICE)
BILIRUBIN URINE: NEGATIVE
Glucose, UA: NEGATIVE mg/dL
Ketones, ur: NEGATIVE mg/dL
NITRITE: NEGATIVE
PROTEIN: NEGATIVE mg/dL
Specific Gravity, Urine: 1.025 (ref 1.005–1.030)
UROBILINOGEN UA: 0.2 mg/dL (ref 0.0–1.0)
pH: 5 (ref 5.0–8.0)

## 2013-12-20 LAB — POCT PREGNANCY, URINE: Preg Test, Ur: NEGATIVE

## 2013-12-20 MED ORDER — MELOXICAM 15 MG PO TABS: 15.0000 mg | ORAL_TABLET | Freq: Two times a day (BID) | ORAL | Status: DC | PRN

## 2013-12-20 MED ORDER — MEGESTROL ACETATE 40 MG PO TABS: 40.0000 mg | ORAL_TABLET | Freq: Two times a day (BID) | ORAL | Status: DC

## 2013-12-20 NOTE — Patient Instructions (Addendum)
Informacin sobre Training and development officer (Hysterectomy Information)  La histerectoma es una ciruga que se realiza para extirpar el tero. Esta ciruga se puede realizar para tratar varios problemas mdicos. Despus de la ciruga, no volver a Personal assistant. Adems, debido a esta ciruga no podr quedar embarazada (estril). Asimismo, durante esta ciruga se podrn extirpar las trompas de falopio y los ovarios (salpingooforectoma bilateral).  RAZONES PARA REALIZAR UNA HISTERECTOMA  Sangrado persistente, anormal.  Dolor o infeccin persistente (crnico) en la pelvis.  El endometrio comienza a desarrollarse fuera del tero (endometriosis).  El endometrio comienza a desarrollarse en el msculo del tero (adenomiosis).  El tero desciende hacia la vagina (prolapso de los rganos de la pelvis).  Neoplasias benignas en el tero (fibromas uterinos) que provocan sntomas.  Clulas precancerosas.  Cncer cervical o cncer uterino. TIPOS DE HISTERECTOMA  Histerectoma supracervical: en este tipo de intervencin, se extirpa la parte superior del tero, pero no el cuello del tero.  Histerectoma total: se extirpan el tero y el cuello del tero.  Histerectoma radical: se extirpan el tero, el cuello del tero y los tejidos fibrosos que sostienen al tero en su lugar en la pelvis (parametrio). FORMAS EN QUE SE PUEDE REALIZAR UNA HISTERECTOMA  Histerectoma abdominal: se realiza un corte quirrgico grande (incisin) en el abdomen. Se extirpa el tero a travs de esta incisin.  Histerectoma vaginal: se realiza una incisin en la vagina. Se extirpa el tero a travs de esta incisin. No hay incisiones abdominales.  Histerectoma laparoscpica convencional: se realizan tres o cuatro incisiones pequeas en el abdomen. Se inserta un tubo delgado y luminoso con una cmara (laparoscopio) en una de las incisiones. A travs del resto de las incisiones se insertan otros instrumentos  quirrgicos. El tero se corta en trozos pequeos. Las piezas pequeas se eliminan a travs de las incisiones, o se retiran a travs de la vagina.  Histerectoma vaginal asistida por laparoscopia (HVAL): se realizan tres o cuatro incisiones pequeas en el abdomen. Parte de la ciruga se realiza por va laparoscpica y parte por va vaginal. El tero se extirpa a travs de la vagina.  Histerectoma laparoscpica asistida por robot: se insertan un laparoscopio y otros instrumentos quirrgicos en 3 o 4 incisiones pequeas en el abdomen. Se utiliza un dispositivo controlado por computadora para que el cirujano vea una imagen en 3D que lo ayuda a Chief Technology Officer los instrumentos quirrgicos. Esto permite movimientos ms precisos de los instrumentos quirrgicos. El tero se corta en trozos pequeos y se retira a travs de las incisiones o se elimina a travs de la vagina. RIESGOS Y [place] complicaciones asociadas con este procedimiento incluyen las siguientes:  Hemorragia y riesgos de Designer, industrial/product una transfusin sangunea. Infrmele al mdico si no quiere recibir ningn tipo de hemoderivados.  Cogulos de [employer] piernas o los pulmones.  Infeccin.  Lesin en los rganos circundantes.  Problemas o efectos secundarios relacionados con la anestesia.  Transformacin de cualquiera de las otras tcnicas en una histerectoma abdominal. QU ESPERAR DESPUES DE UNA HISTERECTOMA  Le darn medicamentos para el dolor.  Necesitar que alguien Nature conservation officer con usted durante los primeros 3 a 5 das despus de que regrese a Medical illustrator.  Tendr que ver al cirujano para un seguimiento 2 a 4 semanas despus de la ciruga para evaluar su progreso.  Posiblemente tenga sntomas tempranos de menopausia, como sofocos, sudoracin nocturna e insomnio.  Si le han realizado una histerectoma debido a un problema que no era cncer ni Ardelia Mems  afeccin que poda causar cncer, ya no necesitar una prueba de  Papanicolaou. Sin embargo, si ya no necesita hacerse un Papanicolau, es una buena idea hacerse un examen regularmente para asegurarse de que no hay otros problemas. Document Released: 10/03/2005 Document Revised: 07/24/2013 [facility] Patient Information 2014 [place], [place].   Histerectoma vaginal asistida por laparoscopa  (Laparoscopically Assisted Vaginal Hysterectomy ) La histerectoma vaginal asistida por laparoscopa (HVAL) es un procedimiento quirrgico para extirpar el tero y el cuello uterino, y en algunos casos los ovarios y las trompas de [place]. Durante la HVAL, cierta parte de la remocin [place] se realiza a travs de la vagina, y el resto a travs de pequeos cortes quirrgicos (incisiones) en el abdomen.  Este procedimiento generalmente se indica en mujeres en las que la histerectoma vaginal no es una opcin. El mdico [employer] riesgos y beneficios de los diferentes tcnicas quirrgicas cuando concurra a la visita. Generalmente el tiempo de recuperacin es rpido y hay menos complicaciones luego de los procedimientos laparoscpicos que en los procedimientos de [place]. INFORME A SU MDICO:   Cualquier alergia que tenga.  Todos los [employer] [place], incluyendo vitaminas, hierbas, gotas oftlmicas, cremas y medicamentos de venta libre.  Problemas previos que usted o los [employer] de su familia hayan tenido con el uso de anestsicos.  Enfermedades de [employer].  Cirugas previas.  Padecimientos mdicos. RIESGOS Y COMPLICACIONES Generalmente es un procedimiento seguro. Sin embargo, Games developer procedimiento, pueden surgir complicaciones. Las complicaciones posibles son:  Risk analyst a los medicamentos.  Dificultad para respirar.  Hemorragias.  Infeccin.  Dao a otras estructuras cercanas al tero y al cuello. ANTES DEL PROCEDIMIENTO  Consulte a su mdico si debe cambiar o suspender los medicamentos que toma habitualmente.  [employer], como los que necesita para prepararse vaciando el colon, segn las indicaciones.  No coma ni beba nada durante al menos 8 horas antes del procedimiento.  Si fuma, abandone el hbito. Si deja de fumar mejorar su salud despus de la [place].  Pdale a alguien que la lleve a su casa despus de la ciruga y la ayude en casa mientras se recupera. PROCEDIMIENTO   Le colocarn una va intravenosa (IV) en una vena para administrarle lquidos y medicamentos.  Recibir medicamentos para relajarse y otros que la harn dormir (anestesia general).  Le colocarn un tubo flexible (catter) en la vejiga para drenar la orina.  Tambin insertarn un tubo a travs de la nariz o la boca hacia el estmago (sonda nasogstrica). La sonda nasogstrica drena los jugos digestivos e impide que sienta nuseas o tenga vmitos.  Le colocarn medias ajustadas (compresin) en las piernas para Product manager.  Le practicarn tres o cuatro incisiones pequeas en el abdomen. Tambin le harn una incisin en la vagina. Le insertarn unas sondas e instrumentos a travs de las pequeas incisiones. Extirparn el tero y el cuello uterino (y posiblemente los ovarios y las trompas de Falopio) a travs de la vagina, as como a travs de las pequeas incisiones que se hicieron en el abdomen.  Luego la vagina se sutura a su estado normal. DESPUS DEL PROCEDIMIENTO  Posiblemente deba seguir una dieta lquida por un tiempo. Lo ms probable es volver a la dieta habitual y Engineer, structural bien al da siguiente de la [place].  Tendr que orinar a travs de un catter. Este se Teacher, English as a foreign language despus de la [place].  Le harn controles regulares de la temperatura, frecuencia cardaca y respiratoria, presin arterial y  nivel de oxgeno.  Seguir [employer] de compresin en las piernas hasta que pueda moverse.  Usar un dispositivo especial o har ejercicios de respiracin para mantener los pulmones  limpios.  La alentarn a caminar lo ms pronto posible. Document Released: 09/22/2011 Document Revised: 06/05/2013 [facility] Patient Information 2014 [place], [place].

## 2013-12-20 NOTE — Progress Notes (Signed)
CLINIC ENCOUNTER NOTE  History:  38 y.o. [id number] (LTCS x 2) here today for discussion about management of menorrhagia; she desires hysterectomy. Also reports lower abdominal pain, itching/burning of her vagina with urination.  Of note, patient mainly speaks [place], Spanish interpreter present for this encounter.  The following portions of the patient's history were reviewed and updated as appropriate: allergies, current medications, past family history, past medical history, past social history, past surgical history and problem list. Benign endometrial biopsy in 08/29/12.  Review of Systems:  Pertinent items are noted in HPI.  Objective:  Physical Exam BP 112/84  Pulse 76  Temp(Src) 98 F (36.7 C)  Wt 180 lb 3.2 oz (81.738 kg)  LMP 12/02/2013 Gen: NAD Abd: Soft, nontender and nondistended Pelvic: Normal appearing external genitalia; normal appearing vaginal mucosa and cervix. Pap smear obtained. Scant white discharge, wet prep sample obtained.  Small uterus, no other palpable masses, no uterine or adnexal tenderness  ENDOMETRIAL BIOPSY     The indications for endometrial biopsy were reviewed.   Risks of the biopsy including cramping, bleeding, infection, uterine perforation, inadequate specimen and need for additional procedures  were discussed. The patient states she understands and agrees to undergo procedure today. Consent was signed. Time out was performed. Urine HCG was negative. During the pelvic exam, the cervix was prepped with Betadine. A single-toothed tenaculum was placed on the anterior lip of the cervix to stabilize it. The 3 mm pipelle was introduced into the endometrial cavity without difficulty to a depth of 9 cm, and a moderate amount of tissue was obtained and sent to pathology. The instruments were removed from the patient's vagina. Minimal bleeding from the cervix was noted. The patient tolerated the procedure well. Routine post-procedure instructions were given to  the patient.    Labs and Imaging 09/11/13  PELVIC ULTRASOUND ([facility])  Uterus 9.2 x 5.7 x 6.1 cm. Two intramural fibroids about 2 cm in diameter.  9 mm endometrial thickness. 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 Normal right ovary. 4 cm mildly complex left ovarian cyst with internal septation and dependent debris/thickening; followup recommended in 6-12 weeks.  Results for orders placed in visit on 12/20/13   POCT URINALYSIS DIP (DEVICE)   Collection Time    12/20/13  9:07 AM      Result Value Ref Range   Glucose, UA NEGATIVE  NEGATIVE mg/dL   Bilirubin Urine NEGATIVE  NEGATIVE   Ketones, ur NEGATIVE  NEGATIVE mg/dL   Specific Gravity, Urine 1.025  1.005 - 1.030   Hgb urine dipstick TRACE (*) NEGATIVE   pH 5.0  5.0 - 8.0   Protein, ur NEGATIVE  NEGATIVE mg/dL   Urobilinogen, UA 0.2  0.0 - 1.0 mg/dL   Nitrite NEGATIVE  NEGATIVE   Leukocytes, UA SMALL (*) NEGATIVE  POCT PREGNANCY, URINE   Collection Time    12/20/13 10:07 AM      Result Value Ref Range   Preg Test, Ur NEGATIVE  NEGATIVE     Assessment & Plan:  Follow up results of pap smear and endometrial biopsy; repeat pelvic ultrasound ordered also to evaluate left ovarian complex cyst.   Will also follow up wet prep results.  Patient desires definitive surgical management with hysterectomy.  I proposed doing a laparoscopic-assisted vaginal hysterectomy (LAVH) and prophylactic bilateral salpingectomy, given concern about adhesions from two previous cesarean sections.  No indication for oophorectomy.  Patient agrees with this proposed surgery.  The risks of surgery were discussed in detail with the patient including but not limited to: bleeding which may require transfusion or reoperation; infection which may require antibiotics; injury to bowel, bladder, ureters or other surrounding organs; need for additional  procedures including laparotomy; thromboembolic phenomenon, incisional problems and other postoperative/anesthesia complications.  Patient was also advised that she will remain in house for 1 day; and expected recovery time after a hysterectomy is 6-8 weeks.  Likelihood of success in alleviating the patient's symptoms was discussed. Routine postoperative instructions will be reviewed with the patient and her family in detail after surgery.  She was told that she will be contacted by our surgical scheduler regarding the time and date of her surgery; routine preoperative instructions of having nothing to eat or drink after midnight on the day prior to surgery and also coming to the hospital 1 1/2 hours prior to her time of surgery were also emphasized.  She was told she may be called for a preoperative appointment about a week prior to surgery and will be given further preoperative instructions at that visit. In the meantime, she was prescribed Meloxicam and Megace; bleeding precautions were reviewed. Printed patient education handouts about the procedure was given to the patient to review at home.    Verita Schneiders, MD, [place] Attending [place], [place]

## 2013-12-20 NOTE — Progress Notes (Signed)
Pt c/o of itching/burning with urination.  Reports cramping/stabbing pain in lower abdomen, especially when she stands up.

## 2013-12-21 LAB — WET PREP, GENITAL
CLUE CELLS WET PREP: NONE SEEN
Trich, Wet Prep: NONE SEEN
WBC, Wet Prep HPF POC: NONE SEEN

## 2013-12-23 ENCOUNTER — Telehealth: Payer: Self-pay | Admitting: *Deleted

## 2013-12-23 DIAGNOSIS — B3731 Acute candidiasis of vulva and vagina: Secondary | ICD-10-CM

## 2013-12-23 DIAGNOSIS — Z789 Other specified health status: Secondary | ICD-10-CM | POA: Insufficient documentation

## 2013-12-23 DIAGNOSIS — N898 Other specified noninflammatory disorders of vagina: Secondary | ICD-10-CM

## 2013-12-23 DIAGNOSIS — B373 Candidiasis of vulva and vagina: Secondary | ICD-10-CM

## 2013-12-23 MED ORDER — FLUCONAZOLE 150 MG PO TABS: 150.0000 mg | ORAL_TABLET | Freq: Once | ORAL | Status: DC

## 2013-12-23 NOTE — Telephone Encounter (Signed)
Contacted patient through interpreter and pt desires for medication to be filled at [facility].  Contacted [facility] pharmacy and verbal order given for medication.

## 2013-12-24 ENCOUNTER — Telehealth: Payer: Self-pay | Admitting: *Deleted

## 2013-12-24 NOTE — Telephone Encounter (Signed)
Message copied by Sue Lush on Tue Dec 24, 2013  4:35 PM ------      Message from: Verita Schneiders A      Created: Mon Dec 23, 2013  3:38 PM       Benign endometrial biopsy. Please call to inform patient of results. ------

## 2013-12-24 NOTE — Telephone Encounter (Signed)
Spoke with patient with assistance of [employer], informed of results, pt verbalizes understanding.

## 2013-12-25 ENCOUNTER — Encounter: Payer: Self-pay | Admitting: *Deleted

## 2014-01-03 ENCOUNTER — Ambulatory Visit (HOSPITAL_COMMUNITY)
Admission: RE | Admit: 2014-01-03 | Discharge: 2014-01-03 | Disposition: A | Discharge status: Home / Self Care | Payer: No Typology Code available for payment source | Source: Ambulatory Visit | Attending: Obstetrics & Gynecology | Admitting: Obstetrics & Gynecology

## 2014-01-03 ENCOUNTER — Ambulatory Visit (HOSPITAL_COMMUNITY): Payer: No Typology Code available for payment source

## 2014-01-03 DIAGNOSIS — D219 Benign neoplasm of connective and other soft tissue, unspecified: Secondary | ICD-10-CM

## 2014-01-03 DIAGNOSIS — D259 Leiomyoma of uterus, unspecified: Secondary | ICD-10-CM | POA: Insufficient documentation

## 2014-01-03 DIAGNOSIS — N83209 Unspecified ovarian cyst, unspecified side: Secondary | ICD-10-CM

## 2014-01-03 DIAGNOSIS — N854 Malposition of uterus: Secondary | ICD-10-CM | POA: Insufficient documentation

## 2014-01-03 DIAGNOSIS — N920 Excessive and frequent menstruation with regular cycle: Secondary | ICD-10-CM

## 2014-01-08 ENCOUNTER — Telehealth: Payer: Self-pay

## 2014-01-08 NOTE — Telephone Encounter (Signed)
Message copied by Geanie Logan on Wed Jan 08, 2014  1:35 PM ------      Message from: Verita Schneiders A      Created: Mon Jan 06, 2014  7:12 PM       Left ovarian cyst has resolved, normal ovaries. Small fibroids in uterus. Will proceed with hysterectomy as planned. Please call to inform patient of results and recommendations; speaks Spanish only. ------

## 2014-01-08 NOTE — Telephone Encounter (Signed)
Called pt. And informed her of ultrasound results. Informed her that they will proceed with the hysterectomy and that she should hear from the surgical schedulers about a day and time for surgery. Informed her the provider will probably want to see her 1-2 weeks prior to surgery, so if it is getting close to surgery and pt. Still hasn't heard about an appointment in our clinic to call clinic. Pt. States she did receive a letter about surgery May 18th. Informed pt. That if she does not hear from [place] by the middle of April with an appointment to call clinic. Pt. Verbalized understanding and has no further questions or concerns.

## 2014-02-19 ENCOUNTER — Encounter: Payer: Self-pay | Admitting: Obstetrics & Gynecology

## 2014-02-19 ENCOUNTER — Ambulatory Visit (INDEPENDENT_AMBULATORY_CARE_PROVIDER_SITE_OTHER): Payer: No Typology Code available for payment source | Admitting: Obstetrics & Gynecology

## 2014-02-19 VITALS — BP 110/76 | HR 76 | Temp 98.0°F | Ht 64.0 in | Wt 182.0 lb

## 2014-02-19 DIAGNOSIS — D219 Benign neoplasm of connective and other soft tissue, unspecified: Secondary | ICD-10-CM

## 2014-02-19 DIAGNOSIS — Z789 Other specified health status: Secondary | ICD-10-CM

## 2014-02-19 DIAGNOSIS — Z609 Problem related to social environment, unspecified: Secondary | ICD-10-CM

## 2014-02-19 DIAGNOSIS — D259 Leiomyoma of uterus, unspecified: Secondary | ICD-10-CM

## 2014-02-19 DIAGNOSIS — Z01818 Encounter for other preprocedural examination: Secondary | ICD-10-CM

## 2014-02-19 DIAGNOSIS — N920 Excessive and frequent menstruation with regular cycle: Secondary | ICD-10-CM

## 2014-02-19 NOTE — Patient Instructions (Signed)
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 Durante la HVAL, cierta parte de la remocin [place] se realiza a travs de la vagina, y el resto a travs de pequeos cortes quirrgicos (incisiones) en el abdomen.  Este procedimiento generalmente se indica en mujeres en las que la histerectoma vaginal no es una opcin. El mdico [employer] riesgos y beneficios de los diferentes tcnicas quirrgicas cuando concurra a la visita. Generalmente el tiempo de recuperacin es rpido y hay menos complicaciones luego de los procedimientos laparoscpicos que en los procedimientos de [place]. INFORME A SU MDICO:   Cualquier alergia que tenga.  Todos los [employer] [place], incluyendo vitaminas, hierbas, gotas oftlmicas, cremas y medicamentos de venta libre.  Problemas previos que usted o los [facility] de su familia hayan tenido con el uso de anestsicos.  Enfermedades de [employer].  Cirugas previas.  Padecimientos mdicos. RIESGOS Y COMPLICACIONES Generalmente es un procedimiento seguro. Sin embargo, Games developer procedimiento, pueden surgir complicaciones. Las complicaciones posibles son:  Risk analyst a los medicamentos.  Dificultad para respirar.  Hemorragias.  Infeccin.  Dao a otras estructuras cercanas al tero y al cuello. ANTES DEL PROCEDIMIENTO  Consulte a su mdico si debe cambiar o suspender los medicamentos que toma habitualmente.  [employer], como los que necesita para prepararse vaciando el colon, segn las indicaciones.  No coma ni beba nada durante al menos 8 horas antes del procedimiento.  Si fuma, abandone el hbito. Si deja de fumar mejorar su salud despus de la [place].  [place] a alguien  que la lleve a su casa despus de la ciruga y la ayude en casa mientras se recupera. PROCEDIMIENTO   Le colocarn una va intravenosa (IV) en una vena para administrarle lquidos y medicamentos.  Recibir medicamentos para relajarse y otros que la harn dormir (anestesia general).  Le colocarn un tubo flexible (catter) en la vejiga para drenar la orina.  Tambin insertarn un tubo a travs de la nariz o la boca hacia el estmago (sonda nasogstrica). La sonda nasogstrica drena los jugos digestivos e impide que sienta nuseas o tenga vmitos.  Le colocarn medias ajustadas (compresin) en las piernas para Product manager.  Le practicarn tres o cuatro incisiones pequeas en el abdomen. Tambin le harn una incisin en la vagina. Le insertarn unas sondas e instrumentos a travs de las pequeas incisiones. Extirparn el tero y el cuello uterino (y posiblemente los ovarios y las trompas de Falopio) a travs de la vagina, as como a travs de las pequeas incisiones que se hicieron en el abdomen.  Luego la vagina se sutura a su estado normal. DESPUS DEL PROCEDIMIENTO  Posiblemente deba seguir una dieta lquida por un tiempo. Lo ms probable es volver a la dieta habitual y Engineer, structural bien al da siguiente de la [place].  Tendr que orinar a travs de un catter. Este se Teacher, English as a foreign language despus de la [place].  Le harn controles regulares de la temperatura, frecuencia cardaca y respiratoria, presin arterial y nivel de oxgeno.  Seguir [employer] de compresin en las piernas hasta que pueda moverse.  Usar un dispositivo especial o har ejercicios de respiracin para mantener los pulmones limpios.  La alentarn a caminar lo ms pronto posible. Document Released: 09/22/2011 Document Revised: 06/05/2013 [facility] Patient Information 2014 [place], [place].  Histerectoma total laparoscpica (Total Laparoscopic Hysterectomy) La histerectoma laparoscpica total es una ciruga  mnimamente invasiva para extirpar el tero y el cuello. Esta ciruga se realiza haciendo pequeos cortes(incisiones) en el abdomen. Tambin se puede hacer con un tubo delgado que ilumina (laparoscopio) que se inserta a travs de dos pequeas incisiones en el abdomen inferior. Tambin durante esta ciruga podrn extirparse las trompas de [place] y los ovarios (salpingo-ooforectoma bilateral). Los beneficios de la ciruga mnimamente invasiva son:  Producer, television/film/video.  Menor riesgo de cogulos sanguneos.  Menor riesgo de sufrir infecciones.  Regreso ms rpido a las [employer]. INFORME A SU MDICO:  Cualquier alergia que tenga.  Todos los [employer] [place], incluyendo vitaminas, hierbas, gotas oftlmicas, cremas y medicamentos de venta libre.  Problemas previos que usted o los [facility] de su familia hayan tenido con el uso de anestsicos.  Enfermedades de [employer].  Cirugas previas.  Padecimientos mdicos. RIESGOS Y COMPLICACIONES  Generalmente es un procedimiento seguro. Sin embargo, Games developer procedimiento, pueden surgir complicaciones. Las complicaciones posibles son:  Hemorragias.  Cogulos de [employer] piernas o los pulmones.  Infeccin.  Lesin en los rganos circundantes.  Problemas con la anestesia.  Sntomas precoces de menopausia (sofocos, sudores nocturnos, insomnio).  Riesgo de conversin a una incisin abdominal abierta. ANTES DEL PROCEDIMIENTO  Consulte a su mdico si debe cambiar o suspender los medicamentos que toma habitualmente.  No tome aspirina ni anticoagulantes durante la semana previa a la [place], o segn le haya indicado su mdico.  No coma ni beba nada durante las 8 horas previas al procedimiento, o segn le haya indicado su mdico.  Si fuma, deje de hacerlo.  Solicite que la lleven a su casa luego de la ciruga y que alguien la ayude con las tareas de la casa durante el tiempo de la recuperacin. PROCEDIMIENTO   Le  administraran antibiticos.  Se le colocar una sonda intravenosa en un brazo. Le administrarn un medicamento que la har dormir (anestesia general).  Le insuflarn un gas (dixido de carbono) en el abdomen. Esto le permitir al cirujano observar el interior del abdomen, Optometrist la ciruga y si es necesario, el tratamiento de otros problemas encontrados.  Le harn tres o cuatro pequeas incisiones (generalmente de menos de  pulgada) en el abdomen. Una de estas incisiones se harn en la zona del ombligo. El laparoscopio se inserta en la incisin. El cirujano observa a travs del laparoscopio mientras realiza el procedimiento.  Otros instrumentos quirrgicos se insertan a travs de las otras incisiones.  El tero se extirpar a travs de la vagina o se seccionar en trozos pequeos y se retirar por las pequeas [place]. DESPUS DEL PROCEDIMIENTO  Se liberar el gas del interior del abdomen.  Despus de la ciruga la llevarn al rea de recuperacin donde un enfermero la observar y Chief Technology Officer su evolucin. Una vez que despierte, se encuentre estabilizada y pueda ingerir lquidos, excepto que ocurra un imprevisto, podr volver a Administrator, arts.  Suele haber pocas molestias despus de la ciruga debido a que las incisiones son muy pequeas.  Se le dar medicamento para el dolor mientras se encuentre en el hospital y para cuando vaya a su casa. Document Released: 09/22/2011 Document Revised: 06/05/2013 [facility] Patient Information 2014 [place], [place].

## 2014-02-19 NOTE — Progress Notes (Signed)
CLINIC ENCOUNTER NOTE  History:  38 y.o. [id number] here today for preoperative visit prior to scheduled LAVH, BS on 03/03/14. Spanish interpreter present. Patient has no concerns.  The following portions of the patient's history were reviewed and updated as appropriate: allergies, current medications, past family history, past medical history, past social history, past surgical history and problem list.  Review of Systems:  Pertinent items are noted in HPI.  Objective:  Physical Exam BP 110/76  Pulse 76  Temp(Src) 98 F (36.7 C) (Oral)  Ht 5\' 4"  (1.626 m)  Wt 182 lb (82.555 kg)  BMI 31.22 kg/m2  LMP 02/11/2014 Gen: NAD Abd: Soft, nontender and nondistended Pelvic: Deferred  Labs and Imaging 12/20/2013 Normal pap smear and negative high-risk HPV.   12/20/2013 Endometrium, biopsy: SECRETORY ENDOMETRIUM, NO HYPERPLASIA OR CARCINOMA.  01/03/2014   TRANSABDOMINAL AND TRANSVAGINAL ULTRASOUND OF PELVIS CLINICAL DATA:  Menorrhagia.  Fibroids.  Followup left ovarian cyst.   TECHNIQUE: Both transabdominal and transvaginal ultrasound examinations of the pelvis were performed. Transabdominal technique was performed for global imaging of the pelvis including uterus, ovaries, adnexal regions, and pelvic cul-de-sac. It was necessary to proceed with endovaginal exam following the transabdominal exam to visualize the uterus, endometrium and ovaries to better advantage.  COMPARISON:  None  FINDINGS: Uterus  Measurements: 7.8 cm x 5.6 cm x 5.6 cm. Uterus is retroverted. Two small probable fibroids. The largest is nearly isoechoic measuring 1.3 cm, mural and location lying in the anterior right upper uterine segment. Here there is more hypoechoic with a small cystic component. It lies in the post upper uterine segment measuring 11 mm in greatest dimension. A third probable fibroid lies in the anterior upper uterine segment a mildly hyperechoic, measuring 9 mm in greatest dimension. No other uterine masses.   Endometrium  Thickness: 9.1 mm. No endometrial mass minimal fluid seen in the upper endometrial canal, nonspecific.  Right ovary  Measurements: 3.0 cm x 2.6 cm x 2.1 cm There are simple appearing cysts followup consistent with physiologic follicular cysts. No adnexal mass.  Left ovary  Measurements: 2.1 cm x 1.7 cm x 1.4 cm. Normal appearance/no adnexal mass.  Other findings  No free fluid.  IMPRESSION: 1. Three small uterine fibroids, all mural and location. These do not affect the endometrium. Uterus otherwise unremarkable. 2. Simple appearing, physiologic, right ovarian cysts. There is no evidence of a complex cyst, which was reported from a previous study dated 09/11/2013. These cysts do not need additional follow-up.   Electronically Signed   By: Lajean Manes M.D.   On: 01/03/2014 11:33     Assessment & Plan:  Patient will undergo a laparoscopic-assisted vaginal hysterectomy (LAVH) and prophylactic bilateral salpingectomy; possible total laparoscopic hysterectomy. There is some concern about adhesions from two previous cesarean sections. No indication for oophorectomy. Prophylactic oophorectomy and associated surgical menopause could have major health risks such as loss of ovarian hormones that could lead to vasomotor symptoms and osteoporosis, increased risk of cardiovascular disease, dementia, procedure-related complications and increased overall mortality. However, patient was counseled regarding a prophylactic bilateral salpingectomy given that a growing body of knowledge reveals that the majority of cases of high grade serous "ovarian" cancer actually are fallopian tube cancers.The precursor lesions are though to arise from the fimbriated end of the fallopian tube and the cancer can have metastases from this primary lesion; and removal of fallopian tubes do not result in any known hormonal imbalance.  Patient agrees with this proposed surgery. The risks of surgery  were discussed in detail with the  patient including but not limited to: bleeding which may require transfusion or reoperation; infection which may require antibiotics; injury to bowel, bladder, ureters or other surrounding organs; need for additional procedures including laparotomy; thromboembolic phenomenon, incisional problems and other postoperative/anesthesia complications. Patient was also advised that she will remain in house for 1 day; and expected recovery time after a hysterectomy is 6-8 weeks. Likelihood of success in alleviating the patient's symptoms was discussed. Routine preoperative instructions of having nothing to eat or drink after midnight on the day prior to surgery and also coming to the hospital 1 1/2 hours prior to her time of surgery were also emphasized.  Routine postoperative instructions will be reviewed with the patient and her family in detail after surgery.   Verita Schneiders, MD, [place] Attending [place], [place]

## 2014-03-02 MED ORDER — DEXTROSE 5 % IV SOLN
2.0000 g | INTRAVENOUS | Status: DC
  Filled 2014-03-02: qty 2

## 2014-03-03 ENCOUNTER — Encounter (HOSPITAL_COMMUNITY): Payer: Self-pay | Admitting: *Deleted

## 2014-03-03 ENCOUNTER — Ambulatory Visit (HOSPITAL_COMMUNITY)
Admission: RE | Admit: 2014-03-03 | Discharge: 2014-03-04 | Disposition: A | Discharge status: Home / Self Care | Payer: No Typology Code available for payment source | Source: Ambulatory Visit | Attending: Obstetrics & Gynecology | Admitting: Obstetrics & Gynecology

## 2014-03-03 ENCOUNTER — Encounter (HOSPITAL_COMMUNITY)
Admission: RE | Disposition: A | Discharge status: Home / Self Care | Payer: Self-pay | Source: Ambulatory Visit | Attending: Obstetrics & Gynecology

## 2014-03-03 ENCOUNTER — Encounter (HOSPITAL_COMMUNITY): Payer: No Typology Code available for payment source | Admitting: Anesthesiology

## 2014-03-03 ENCOUNTER — Ambulatory Visit (HOSPITAL_COMMUNITY): Payer: No Typology Code available for payment source | Admitting: Anesthesiology

## 2014-03-03 DIAGNOSIS — N83209 Unspecified ovarian cyst, unspecified side: Secondary | ICD-10-CM | POA: Insufficient documentation

## 2014-03-03 DIAGNOSIS — N854 Malposition of uterus: Secondary | ICD-10-CM | POA: Insufficient documentation

## 2014-03-03 DIAGNOSIS — N80209 Endometriosis of unspecified fallopian tube, unspecified depth: Secondary | ICD-10-CM | POA: Insufficient documentation

## 2014-03-03 DIAGNOSIS — D219 Benign neoplasm of connective and other soft tissue, unspecified: Secondary | ICD-10-CM | POA: Diagnosis present

## 2014-03-03 DIAGNOSIS — Z87891 Personal history of nicotine dependence: Secondary | ICD-10-CM | POA: Insufficient documentation

## 2014-03-03 DIAGNOSIS — D259 Leiomyoma of uterus, unspecified: Secondary | ICD-10-CM

## 2014-03-03 DIAGNOSIS — N8000 Endometriosis of the uterus, unspecified: Secondary | ICD-10-CM | POA: Insufficient documentation

## 2014-03-03 DIAGNOSIS — N920 Excessive and frequent menstruation with regular cycle: Secondary | ICD-10-CM

## 2014-03-03 DIAGNOSIS — Z90710 Acquired absence of both cervix and uterus: Secondary | ICD-10-CM | POA: Diagnosis present

## 2014-03-03 DIAGNOSIS — N802 Endometriosis of fallopian tube: Secondary | ICD-10-CM | POA: Insufficient documentation

## 2014-03-03 HISTORY — PX: LAPAROSCOPIC HYSTERECTOMY: SHX1926

## 2014-03-03 HISTORY — PX: CYSTOSCOPY: SHX5120

## 2014-03-03 HISTORY — PX: LAPAROSCOPIC BILATERAL SALPINGECTOMY: SHX5889

## 2014-03-03 LAB — CBC
HEMATOCRIT: 35.2 % — AB (ref 36.0–46.0)
Hemoglobin: 11.4 g/dL — ABNORMAL LOW (ref 12.0–15.0)
MCH: 28.8 pg (ref 26.0–34.0)
MCHC: 32.4 g/dL (ref 30.0–36.0)
MCV: 88.9 fL (ref 78.0–100.0)
Platelets: 235 10*3/uL (ref 150–400)
RBC: 3.96 MIL/uL (ref 3.87–5.11)
RDW: 12.7 % (ref 11.5–15.5)
WBC: 10.2 10*3/uL (ref 4.0–10.5)

## 2014-03-03 LAB — PREGNANCY, URINE: Preg Test, Ur: NEGATIVE

## 2014-03-03 LAB — ABO/RH: ABO/RH(D): O POS

## 2014-03-03 LAB — TYPE AND SCREEN
ABO/RH(D): O POS
Antibody Screen: NEGATIVE

## 2014-03-03 SURGERY — SALPINGECTOMY, BILATERAL, LAPAROSCOPIC
Anesthesia: General | Site: Vagina

## 2014-03-03 MED ORDER — PROPOFOL 10 MG/ML IV BOLUS
INTRAVENOUS | Status: DC | PRN
  Administered 2014-03-03: 200 mg via INTRAVENOUS

## 2014-03-03 MED ORDER — MIDAZOLAM HCL 2 MG/2ML IJ SOLN
INTRAMUSCULAR | Status: DC | PRN
  Administered 2014-03-03: 2 mg via INTRAVENOUS

## 2014-03-03 MED ORDER — SIMETHICONE 80 MG PO CHEW: 80.0000 mg | CHEWABLE_TABLET | Freq: Four times a day (QID) | ORAL | Status: DC | PRN

## 2014-03-03 MED ORDER — STERILE WATER FOR IRRIGATION IR SOLN
Status: DC | PRN
  Administered 2014-03-03: 1000 mL

## 2014-03-03 MED ORDER — ONDANSETRON HCL 4 MG/2ML IJ SOLN
INTRAMUSCULAR | Status: AC
  Filled 2014-03-03: qty 2

## 2014-03-03 MED ORDER — FENTANYL CITRATE 0.05 MG/ML IJ SOLN
INTRAMUSCULAR | Status: DC | PRN
  Administered 2014-03-03 (×2): 50 ug via INTRAVENOUS
  Administered 2014-03-03: 100 ug via INTRAVENOUS
  Administered 2014-03-03 (×2): 50 ug via INTRAVENOUS

## 2014-03-03 MED ORDER — PROMETHAZINE HCL 25 MG/ML IJ SOLN: 6.2500 mg | INTRAMUSCULAR | Status: DC | PRN

## 2014-03-03 MED ORDER — HYDROMORPHONE HCL PF 1 MG/ML IJ SOLN
INTRAMUSCULAR | Status: AC
  Filled 2014-03-03: qty 1

## 2014-03-03 MED ORDER — GLYCOPYRROLATE 0.2 MG/ML IJ SOLN
INTRAMUSCULAR | Status: DC | PRN
  Administered 2014-03-03: 0.6 mg via INTRAVENOUS

## 2014-03-03 MED ORDER — GLYCOPYRROLATE 0.2 MG/ML IJ SOLN
INTRAMUSCULAR | Status: AC
  Filled 2014-03-03: qty 3

## 2014-03-03 MED ORDER — LACTATED RINGERS IV SOLN
INTRAVENOUS | Status: DC
  Administered 2014-03-03: 19:00:00 via INTRAVENOUS

## 2014-03-03 MED ORDER — FENTANYL CITRATE 0.05 MG/ML IJ SOLN
INTRAMUSCULAR | Status: AC
  Filled 2014-03-03: qty 5

## 2014-03-03 MED ORDER — LACTATED RINGERS IV SOLN
INTRAVENOUS | Status: DC
  Administered 2014-03-03 (×3): via INTRAVENOUS

## 2014-03-03 MED ORDER — IBUPROFEN 600 MG PO TABS: 600.0000 mg | ORAL_TABLET | Freq: Four times a day (QID) | ORAL | Status: DC | PRN

## 2014-03-03 MED ORDER — KETOROLAC TROMETHAMINE 30 MG/ML IJ SOLN: 30.0000 mg | Freq: Once | INTRAMUSCULAR | Status: DC

## 2014-03-03 MED ORDER — PANTOPRAZOLE SODIUM 40 MG PO TBEC
40.0000 mg | DELAYED_RELEASE_TABLET | Freq: Every day | ORAL | Status: DC
  Filled 2014-03-03 (×3): qty 1

## 2014-03-03 MED ORDER — DOCUSATE SODIUM 100 MG PO CAPS: 100.0000 mg | ORAL_CAPSULE | Freq: Two times a day (BID) | ORAL | Status: DC | PRN

## 2014-03-03 MED ORDER — HYDROMORPHONE HCL PF 1 MG/ML IJ SOLN
INTRAMUSCULAR | Status: DC | PRN
Start: 2014-03-03 — End: 2014-03-03
  Administered 2014-03-03: 0.5 mg via INTRAVENOUS

## 2014-03-03 MED ORDER — OXYCODONE-ACETAMINOPHEN 5-325 MG PO TABS
1.0000 | ORAL_TABLET | ORAL | Status: DC | PRN
Start: 2014-03-03 — End: 2014-03-04
  Administered 2014-03-04: 1 via ORAL
  Filled 2014-03-03: qty 1

## 2014-03-03 MED ORDER — DEXAMETHASONE SODIUM PHOSPHATE 10 MG/ML IJ SOLN
INTRAMUSCULAR | Status: AC
  Filled 2014-03-03: qty 1

## 2014-03-03 MED ORDER — NEOSTIGMINE METHYLSULFATE 10 MG/10ML IV SOLN
INTRAVENOUS | Status: DC | PRN
  Administered 2014-03-03: 3 mg via INTRAVENOUS

## 2014-03-03 MED ORDER — METHYLENE BLUE 1 % INJ SOLN
INTRAMUSCULAR | Status: AC
  Filled 2014-03-03: qty 10

## 2014-03-03 MED ORDER — ONDANSETRON HCL 4 MG/2ML IJ SOLN
INTRAMUSCULAR | Status: DC | PRN
  Administered 2014-03-03: 4 mg via INTRAVENOUS

## 2014-03-03 MED ORDER — IBUPROFEN 600 MG PO TABS
600.0000 mg | ORAL_TABLET | Freq: Four times a day (QID) | ORAL | Status: DC | PRN
  Administered 2014-03-04: 600 mg via ORAL
  Filled 2014-03-03: qty 1

## 2014-03-03 MED ORDER — MENTHOL 3 MG MT LOZG
1.0000 | LOZENGE | OROMUCOSAL | Status: DC | PRN
  Administered 2014-03-03: 3 mg via ORAL
  Filled 2014-03-03: qty 9

## 2014-03-03 MED ORDER — ROCURONIUM BROMIDE 100 MG/10ML IV SOLN
INTRAVENOUS | Status: DC | PRN
  Administered 2014-03-03 (×2): 10 mg via INTRAVENOUS
  Administered 2014-03-03: 30 mg via INTRAVENOUS

## 2014-03-03 MED ORDER — DEXAMETHASONE SODIUM PHOSPHATE 10 MG/ML IJ SOLN
INTRAMUSCULAR | Status: DC | PRN
  Administered 2014-03-03: 10 mg via INTRAVENOUS

## 2014-03-03 MED ORDER — KETOROLAC TROMETHAMINE 30 MG/ML IJ SOLN
INTRAMUSCULAR | Status: DC | PRN
  Administered 2014-03-03: 30 mg via INTRAVENOUS

## 2014-03-03 MED ORDER — LIDOCAINE HCL (CARDIAC) 20 MG/ML IV SOLN
INTRAVENOUS | Status: DC | PRN
  Administered 2014-03-03: 60 mg via INTRAVENOUS

## 2014-03-03 MED ORDER — HYDROMORPHONE HCL PF 1 MG/ML IJ SOLN
0.2000 mg | INTRAMUSCULAR | Status: DC | PRN
  Administered 2014-03-03 (×2): 0.6 mg via INTRAVENOUS
  Filled 2014-03-03 (×2): qty 1

## 2014-03-03 MED ORDER — MIDAZOLAM HCL 2 MG/2ML IJ SOLN: 0.5000 mg | Freq: Once | INTRAMUSCULAR | Status: DC | PRN

## 2014-03-03 MED ORDER — ONDANSETRON HCL 4 MG PO TABS: 4.0000 mg | ORAL_TABLET | Freq: Four times a day (QID) | ORAL | Status: DC | PRN

## 2014-03-03 MED ORDER — LIDOCAINE HCL (CARDIAC) 20 MG/ML IV SOLN
INTRAVENOUS | Status: AC
  Filled 2014-03-03: qty 5

## 2014-03-03 MED ORDER — ROCURONIUM BROMIDE 100 MG/10ML IV SOLN
INTRAVENOUS | Status: AC
  Filled 2014-03-03: qty 1

## 2014-03-03 MED ORDER — MIDAZOLAM HCL 2 MG/2ML IJ SOLN
INTRAMUSCULAR | Status: AC
  Filled 2014-03-03: qty 2

## 2014-03-03 MED ORDER — MEPERIDINE HCL 25 MG/ML IJ SOLN: 6.2500 mg | INTRAMUSCULAR | Status: DC | PRN

## 2014-03-03 MED ORDER — LACTATED RINGERS IV SOLN: INTRAVENOUS | Status: DC

## 2014-03-03 MED ORDER — KETOROLAC TROMETHAMINE 30 MG/ML IJ SOLN: 15.0000 mg | Freq: Once | INTRAMUSCULAR | Status: DC | PRN

## 2014-03-03 MED ORDER — BUPIVACAINE HCL (PF) 0.5 % IJ SOLN
INTRAMUSCULAR | Status: DC | PRN
  Administered 2014-03-03: 14 mL

## 2014-03-03 MED ORDER — BUPIVACAINE HCL (PF) 0.5 % IJ SOLN
INTRAMUSCULAR | Status: AC
  Filled 2014-03-03: qty 30

## 2014-03-03 MED ORDER — FENTANYL CITRATE 0.05 MG/ML IJ SOLN
INTRAMUSCULAR | Status: AC
  Administered 2014-03-03: 50 ug via INTRAVENOUS
  Filled 2014-03-03: qty 2

## 2014-03-03 MED ORDER — LACTATED RINGERS IR SOLN
Status: DC | PRN
  Administered 2014-03-03: 3000 mL

## 2014-03-03 MED ORDER — ONDANSETRON HCL 4 MG/2ML IJ SOLN
4.0000 mg | Freq: Four times a day (QID) | INTRAMUSCULAR | Status: DC | PRN
  Administered 2014-03-03 – 2014-03-04 (×2): 4 mg via INTRAVENOUS
  Filled 2014-03-03 (×2): qty 2

## 2014-03-03 MED ORDER — FENTANYL CITRATE 0.05 MG/ML IJ SOLN
INTRAMUSCULAR | Status: AC
  Filled 2014-03-03: qty 2

## 2014-03-03 MED ORDER — DOCUSATE SODIUM 100 MG PO CAPS
100.0000 mg | ORAL_CAPSULE | Freq: Two times a day (BID) | ORAL | Status: DC
  Administered 2014-03-03: 100 mg via ORAL
  Filled 2014-03-03: qty 1

## 2014-03-03 MED ORDER — OXYCODONE-ACETAMINOPHEN 5-325 MG PO TABS: 1.0000 | ORAL_TABLET | Freq: Four times a day (QID) | ORAL | Status: DC | PRN

## 2014-03-03 MED ORDER — METHYLENE BLUE 1 % INJ SOLN
INTRAMUSCULAR | Status: DC | PRN
  Administered 2014-03-03: 8 mL via INTRAVENOUS

## 2014-03-03 MED ORDER — NEOSTIGMINE METHYLSULFATE 10 MG/10ML IV SOLN
INTRAVENOUS | Status: AC
  Filled 2014-03-03: qty 1

## 2014-03-03 MED ORDER — DEXTROSE 5 % IV SOLN
2.0000 g | INTRAVENOUS | Status: DC | PRN
  Administered 2014-03-03: 2 g via INTRAVENOUS

## 2014-03-03 MED ORDER — PROPOFOL 10 MG/ML IV EMUL
INTRAVENOUS | Status: AC
  Filled 2014-03-03: qty 20

## 2014-03-03 MED ORDER — FENTANYL CITRATE 0.05 MG/ML IJ SOLN
25.0000 ug | INTRAMUSCULAR | Status: DC | PRN
  Administered 2014-03-03 (×2): 50 ug via INTRAVENOUS

## 2014-03-03 SURGICAL SUPPLY — 43 items
APPLICATOR COTTON TIP 6IN STRL (MISCELLANEOUS) ×6 IMPLANT
CABLE HIGH FREQUENCY MONO STRZ (ELECTRODE) ×6 IMPLANT
CANISTER SUCT 3000ML (MISCELLANEOUS) ×6 IMPLANT
CHLORAPREP W/TINT 26ML (MISCELLANEOUS) ×6 IMPLANT
CLOTH BEACON ORANGE TIMEOUT ST (SAFETY) ×6 IMPLANT
CONT PATH 16OZ SNAP LID 3702 (MISCELLANEOUS) ×6 IMPLANT
COVER TABLE BACK 60X90 (DRAPES) ×6 IMPLANT
DECANTER SPIKE VIAL GLASS SM (MISCELLANEOUS) ×6 IMPLANT
DERMABOND ADVANCED (GAUZE/BANDAGES/DRESSINGS) ×2
DERMABOND ADVANCED .7 DNX12 (GAUZE/BANDAGES/DRESSINGS) ×4 IMPLANT
DEVICE SUTURE ENDOST 10MM (ENDOMECHANICALS) ×6 IMPLANT
ELECT REM PT RETURN 9FT ADLT (ELECTROSURGICAL) ×6
ELECTRODE REM PT RTRN 9FT ADLT (ELECTROSURGICAL) ×4 IMPLANT
ENDOSTITCH 0 SINGLE 48 (SUTURE) ×24 IMPLANT
EVACUATOR SMOKE 8.L (FILTER) ×6 IMPLANT
GLOVE BIOGEL PI IND STRL 6.5 (GLOVE) ×16 IMPLANT
GLOVE BIOGEL PI IND STRL 7.0 (GLOVE) ×24 IMPLANT
GLOVE BIOGEL PI INDICATOR 6.5 (GLOVE) ×8
GLOVE BIOGEL PI INDICATOR 7.0 (GLOVE) ×12
GLOVE ECLIPSE 7.0 STRL STRAW (GLOVE) ×18 IMPLANT
GLOVE SURG SS PI 7.0 STRL IVOR (GLOVE) ×30 IMPLANT
GOWN L4 XXLG W/PAP TWL (GOWN DISPOSABLE) ×6 IMPLANT
GOWN STRL REUS W/TWL LRG LVL3 (GOWN DISPOSABLE) ×18 IMPLANT
GOWN STRL REUS W/TWL XL LVL3 (GOWN DISPOSABLE) ×6 IMPLANT
HEMOSTAT SURGICEL 2X3 (HEMOSTASIS) ×6 IMPLANT
NEEDLE HYPO 22GX1.5 SAFETY (NEEDLE) ×6 IMPLANT
NS IRRIG 1000ML POUR BTL (IV SOLUTION) ×6 IMPLANT
OCCLUDER COLPOPNEUMO (BALLOONS) ×6 IMPLANT
PACK LAPAROSCOPY BASIN (CUSTOM PROCEDURE TRAY) ×6 IMPLANT
PACK LAVH (CUSTOM PROCEDURE TRAY) ×6 IMPLANT
PROTECTOR NERVE ULNAR (MISCELLANEOUS) ×12 IMPLANT
SET CYSTO W/LG BORE CLAMP LF (SET/KITS/TRAYS/PACK) ×6 IMPLANT
SET IRRIG TUBING LAPAROSCOPIC (IRRIGATION / IRRIGATOR) ×6 IMPLANT
SHEARS HARMONIC ACE PLUS 36CM (ENDOMECHANICALS) ×6 IMPLANT
SUT VICRYL 0 UR6 27IN ABS (SUTURE) ×18 IMPLANT
SUT VICRYL 4-0 PS2 18IN ABS (SUTURE) ×18 IMPLANT
TIP UTERINE 6.7X8CM BLUE DISP (MISCELLANEOUS) ×6 IMPLANT
TOWEL OR 17X24 6PK STRL BLUE (TOWEL DISPOSABLE) ×18 IMPLANT
TRAY FOLEY CATH 14FR (SET/KITS/TRAYS/PACK) ×6 IMPLANT
TROCAR XCEL NON-BLD 11X100MML (ENDOMECHANICALS) ×24 IMPLANT
TROCAR XCEL NON-BLD 5MMX100MML (ENDOMECHANICALS) ×18 IMPLANT
WARMER LAPAROSCOPE (MISCELLANEOUS) ×6 IMPLANT
WATER STERILE IRR 1000ML POUR (IV SOLUTION) ×6 IMPLANT

## 2014-03-03 NOTE — Anesthesia Postprocedure Evaluation (Signed)
  Anesthesia Post-op Note  Anesthesia Post Note  Patient: Pamela Mcfarland  Procedure(s) Performed: Procedure(s) (LRB): LAPAROSCOPIC BILATERAL SALPINGECTOMY (Bilateral) HYSTERECTOMY TOTAL LAPAROSCOPIC (N/A) CYSTOSCOPY (N/A)  Anesthesia type: General  Patient location: PACU  Post pain: Pain level controlled  Post assessment: Post-op Vital signs reviewed  Last Vitals:  Filed Vitals:   03/03/14 1330  BP: 105/81  Pulse: 88  Temp: 36.7 C  Resp: 16    Post vital signs: Reviewed  Level of consciousness: sedated  Complications: No apparent anesthesia complications

## 2014-03-03 NOTE — Addendum Note (Signed)
Addendum created 03/03/14 1540 by Ignacia Bayley, CRNA   Modules edited: Notes Section   Notes Section:  File: [id number]

## 2014-03-03 NOTE — Discharge Instructions (Signed)
Total Laparoscopic Hysterectomy, Care After  Refer to this sheet in the next few weeks. These instructions provide you with information on caring for yourself after your procedure. Your health care provider may also give you more specific instructions. Your treatment has been planned according to current medical practices, but problems sometimes occur. Call your health care provider if you have any problems or questions after your procedure.  WHAT TO EXPECT AFTER THE PROCEDURE  · Pain and bruising at the incision sites. You will be given pain medicine to control it.  · Menopausal symptoms such as hot flashes, night sweats, and insomnia if your ovaries were removed.  · Sore throat from the breathing tube that was inserted during surgery.  HOME CARE INSTRUCTIONS  · Only take over-the-counter or prescription medicines for pain, discomfort, or fever as directed by your health care provider.    · Do not take aspirin. It can cause bleeding.    · Do not drive when taking pain medicine.    · Follow your health care provider's advice regarding diet, exercise, lifting, driving, and general activities.    · Resume your usual diet as directed and allowed.    · Get plenty of rest and sleep.    · Do not douche, use tampons, or have sexual intercourse for at least 6 weeks, or until your health care provider gives you permission.    · Change your bandages (dressings) as directed by your health care provider.    · Monitor your temperature and notify your health care provider of a fever.    · Take showers instead of baths for 2 3 weeks.    · Do not drink alcohol until your health care provider gives you permission.    · If you develop constipation, you may take a mild laxative with your health care provider's permission. Bran foods may help with constipation problems. Drinking enough fluids to keep your urine clear or pale yellow may help as well.    · Try to have someone home with you for 1 2 weeks to help around the house.     · Keep all of your follow-up appointments as directed by your health care provider.    SEEK MEDICAL CARE IF:  · You have swelling, redness, or increasing pain around your incision sites.    · You have pus coming from your incision.    · You notice a bad smell coming from your incision.    · Your incision breaks open.    · You feel dizzy or lightheaded.    · You have pain or bleeding when you urinate.    · You have persistent diarrhea.    · You have persistent nausea and vomiting.    · You have abnormal vaginal discharge.    · You have a rash.    · You have any type of abnormal reaction or develop an allergy to your medicine.    · You have poor pain control with your prescribed medicine.    SEEK IMMEDIATE MEDICAL CARE IF:  · You have chest pain or shortness of breath.  · You have severe abdominal pain that is not relieved with pain medicine.  · You have pain or swelling in your legs.  Document Released: 07/24/2013 Document Reviewed: 04/23/2013  ExitCare® Patient Information ©2014 [employer].

## 2014-03-03 NOTE — Anesthesia Postprocedure Evaluation (Signed)
  Anesthesia Post-op Note  Patient: Pamela Mcfarland  Procedure(s) Performed: Procedure(s): LAPAROSCOPIC BILATERAL SALPINGECTOMY (Bilateral) HYSTERECTOMY TOTAL LAPAROSCOPIC (N/A) CYSTOSCOPY (N/A)  Patient Location: [facility]  Anesthesia Type:General  Level of Consciousness: awake  Airway and Oxygen Therapy: Patient Spontanous Breathing  Post-op Pain: mild  Post-op Assessment: Patient's Cardiovascular Status Stable and Respiratory Function Stable  Post-op Vital Signs: stable  Last Vitals:  Filed Vitals:   03/03/14 1510  BP: 113/62  Pulse: 93  Temp: 36.4 C  Resp: 16    Complications: No apparent anesthesia complications

## 2014-03-03 NOTE — Transfer of Care (Signed)
Immediate Anesthesia Transfer of Care Note  Patient: Pamela Mcfarland  Procedure(s) Performed: Procedure(s): LAPAROSCOPIC BILATERAL SALPINGECTOMY (Bilateral) HYSTERECTOMY TOTAL LAPAROSCOPIC (N/A) CYSTOSCOPY (N/A)  Patient Location: PACU  Anesthesia Type:General  Level of Consciousness: awake, alert , oriented and patient cooperative  Airway & Oxygen Therapy: Patient Spontanous Breathing and Patient connected to nasal cannula oxygen  Post-op Assessment: Report given to PACU RN and Post -op Vital signs reviewed and stable  Post vital signs: Reviewed and stable  Complications: No apparent anesthesia complications

## 2014-03-03 NOTE — H&P (View-Only) (Signed)
CLINIC ENCOUNTER NOTE  History:  38 y.o. [id number] here today for preoperative visit prior to scheduled LAVH, BS on 03/03/14. Spanish interpreter present. Patient has no concerns.  The following portions of the patient's history were reviewed and updated as appropriate: allergies, current medications, past family history, past medical history, past social history, past surgical history and problem list.  Review of Systems:  Pertinent items are noted in HPI.  Objective:  Physical Exam BP 110/76  Pulse 76  Temp(Src) 98 F (36.7 C) (Oral)  Ht 5\' 4"  (1.626 m)  Wt 182 lb (82.555 kg)  BMI 31.22 kg/m2  LMP 02/11/2014 Gen: NAD Abd: Soft, nontender and nondistended Pelvic: Deferred  Labs and Imaging 12/20/2013 Normal pap smear and negative high-risk HPV.   12/20/2013 Endometrium, biopsy: SECRETORY ENDOMETRIUM, NO HYPERPLASIA OR CARCINOMA.  01/03/2014   TRANSABDOMINAL AND TRANSVAGINAL ULTRASOUND OF PELVIS CLINICAL DATA:  Menorrhagia.  Fibroids.  Followup left ovarian cyst.   TECHNIQUE: Both transabdominal and transvaginal ultrasound examinations of the pelvis were performed. Transabdominal technique was performed for global imaging of the pelvis including uterus, ovaries, adnexal regions, and pelvic cul-de-sac. It was necessary to proceed with endovaginal exam following the transabdominal exam to visualize the uterus, endometrium and ovaries to better advantage.  COMPARISON:  None  FINDINGS: Uterus  Measurements: 7.8 cm x 5.6 cm x 5.6 cm. Uterus is retroverted. Two small probable fibroids. The largest is nearly isoechoic measuring 1.3 cm, mural and location lying in the anterior right upper uterine segment. Here there is more hypoechoic with a small cystic component. It lies in the post upper uterine segment measuring 11 mm in greatest dimension. A third probable fibroid lies in the anterior upper uterine segment a mildly hyperechoic, measuring 9 mm in greatest dimension. No other uterine masses.   Endometrium  Thickness: 9.1 mm. No endometrial mass minimal fluid seen in the upper endometrial canal, nonspecific.  Right ovary  Measurements: 3.0 cm x 2.6 cm x 2.1 cm There are simple appearing cysts followup consistent with physiologic follicular cysts. No adnexal mass.  Left ovary  Measurements: 2.1 cm x 1.7 cm x 1.4 cm. Normal appearance/no adnexal mass.  Other findings  No free fluid.  IMPRESSION: 1. Three small uterine fibroids, all mural and location. These do not affect the endometrium. Uterus otherwise unremarkable. 2. Simple appearing, physiologic, right ovarian cysts. There is no evidence of a complex cyst, which was reported from a previous study dated 09/11/2013. These cysts do not need additional follow-up.   Electronically Signed   By: Lajean Manes M.D.   On: 01/03/2014 11:33     Assessment & Plan:  Patient will undergo a laparoscopic-assisted vaginal hysterectomy (LAVH) and prophylactic bilateral salpingectomy; possible total laparoscopic hysterectomy. There is some concern about adhesions from two previous cesarean sections. No indication for oophorectomy. Prophylactic oophorectomy and associated surgical menopause could have major health risks such as loss of ovarian hormones that could lead to vasomotor symptoms and osteoporosis, increased risk of cardiovascular disease, dementia, procedure-related complications and increased overall mortality. However, patient was counseled regarding a prophylactic bilateral salpingectomy given that a growing body of knowledge reveals that the majority of cases of high grade serous "ovarian" cancer actually are fallopian tube cancers.The precursor lesions are though to arise from the fimbriated end of the fallopian tube and the cancer can have metastases from this primary lesion; and removal of fallopian tubes do not result in any known hormonal imbalance.  Patient agrees with this proposed surgery. The risks of surgery  were discussed in detail with the  patient including but not limited to: bleeding which may require transfusion or reoperation; infection which may require antibiotics; injury to bowel, bladder, ureters or other surrounding organs; need for additional procedures including laparotomy; thromboembolic phenomenon, incisional problems and other postoperative/anesthesia complications. Patient was also advised that she will remain in house for 1 day; and expected recovery time after a hysterectomy is 6-8 weeks. Likelihood of success in alleviating the patient's symptoms was discussed. Routine preoperative instructions of having nothing to eat or drink after midnight on the day prior to surgery and also coming to the hospital 1 1/2 hours prior to her time of surgery were also emphasized.  Routine postoperative instructions will be reviewed with the patient and her family in detail after surgery.   Verita Schneiders, MD, [place] Attending [place], [place]

## 2014-03-03 NOTE — Anesthesia Preprocedure Evaluation (Signed)
Anesthesia Evaluation  Patient identified by MRN, date of birth, ID band Patient awake    Reviewed: Allergy & Precautions, H&P , Patient's Chart, lab work & pertinent test results, reviewed documented beta blocker date and time   History of Anesthesia Complications Negative for: history of anesthetic complications  Airway Mallampati: II TM Distance: >3 FB Neck ROM: full    Dental   Pulmonary former smoker,  breath sounds clear to auscultation        Cardiovascular Exercise Tolerance: Good Rhythm:regular Rate:Normal     Neuro/Psych    GI/Hepatic   Endo/Other    Renal/GU      Musculoskeletal  (+) Arthritis -,   Abdominal   Peds  Hematology   Anesthesia Other Findings RA  Reproductive/Obstetrics                           Anesthesia Physical Anesthesia Plan  ASA: II  Anesthesia Plan: General ETT   Post-op Pain Management:    Induction:   Airway Management Planned:   Additional Equipment:   Intra-op Plan:   Post-operative Plan:   Informed Consent: I have reviewed the patients History and Physical, chart, labs and discussed the procedure including the risks, benefits and alternatives for the proposed anesthesia with the patient or authorized representative who has indicated his/her understanding and acceptance.   Dental Advisory Given  Plan Discussed with: CRNA and Surgeon  Anesthesia Plan Comments:         Anesthesia Quick Evaluation

## 2014-03-03 NOTE — Interval H&P Note (Signed)
History and Physical Interval Note 03/03/2014 9:56 AM  Pamela Mcfarland  has presented today for surgery, with the diagnosis of menorrhagia, fibroids, left ovarian complex cyst, two previous, cesarean sections.   The various methods of treatment have been discussed with the patient and family. After consideration of risks, benefits and other options for treatment, the patient has consented to [place], BILATERAL SALPINGECTOMY, Possible LSO as a surgical intervention.  The patient's history has been reviewed, patient examined, no change in status, stable for surgery.  I have reviewed the patient's chart and labs.  Questions were answered to the patient's satisfaction.  To OR when ready. Of note, preoperative counseling done with the help of a Spanish interpreter.   Pamela Schneiders, MD, [place] Attending [place], [place]

## 2014-03-03 NOTE — Op Note (Signed)
PROCEDURE DATE: 03/03/2014   PREOPERATIVE DIAGNOSIS: Fibroids, menorrhagia, two previous cesarean sections POSTOPERATIVE DIAGNOSIS: The same  PROCEDURE: Total laparoscopic hysterectomy, bilateral salpingectomy, cystoscopy  SURGEON: Dr. Verita Schneiders  ASSISTANT: Dr. Clovia Cuff   INDICATIONS: 38 y.o. [id number]  here for definitive surgical management secondary to the indications listed under preoperative diagnoses; please see preoperative note for further details.Risks of surgery were discussed with the patient including but not limited to: bleeding which may require transfusion or reoperation; infection which may require antibiotics; injury to bowel, bladder, ureters or other surrounding organs; need for additional procedures including laparotomy; thromboembolic phenomenon, incisional problems and other postoperative/anesthesia complications. Written informed consent was obtained.   FINDINGS: Enlarged fibroid uterus. Multifollicular ovaries. Normal upper abdomen.   ANESTHESIA: General  INTRAVENOUS FLUIDS:2100 ml  ESTIMATED BLOOD LOSS:200 ml  URINE OUTPUT: 100 ml  SPECIMENS: Uterus, cervix, bilateral fallopian tubes  COMPLICATIONS: None immediate   PROCEDURE IN DETAIL: The patient received intravenous antibiotics and had sequential compression devices applied to her lower extremities while in the preoperative area. She was then taken to the operating room where general anesthesia was administered and was found to be adequate. She was placed in the dorsal lithotomy position, and was prepped and draped in a sterile manner. A RUMI uterine manipulator was placed at this time. A Foley catheter was inserted into her bladder and attached to constant drainage. After an adequate timeout was performed, attention was turned to the abdomen where an umbilical incision was made with the scalpel. The Optiview 11-mm trocar and sleeve were then advanced without difficulty with the laparoscope under direct visualization  into the abdomen. The abdomen was then insufflated with carbon dioxide gas and adequate pneumoperitoneum was obtained. A survey of the patient's pelvis and abdomen revealed the findings above. Bilateral lower quadrant ports (11-mm on the right and 5-mm on the left) were then placed under direct visualization. The pelvis was then carefully examined. Attention was turned to the fallopian tubes; these were freed from the underlying mesosalpinx and the uterine attachments using the Harmonic device. The bilateral round and broad ligaments were then clamped and transected with the Harmonic device. The uterine artery was then skeletonized and a bladder flap was created. The ureters were noted to be safely away from the area of dissection. The bladder was then bluntly dissected off the lower uterine segment. At this point, attention was turned to the uterine vessels, which were clamped and ligated using the Harmonic scalpel. Good hemostasis was noted overall. The uterosacral and cardinal ligaments were clamped, cut and ligated bilaterally . Attention was then turned to the cervicovaginal junction, and the Harmonic device was used to transect the cervix from the surrounding vagina using the ring of the RUMI as a guide. This was done circumferentially allowing total hysterectomy. The uterus was then removed from the vagina and the vaginal cuff incision was then closed with four 0 Vicryl interrupted sutures using the Endostitch device. Overall excellent hemostasis was noted. The ureters were reexamined bilaterally and were pulsating normally. The abdominal pressure was reduced and hemostasis was confirmed. Intravenous methylene blue was administered, and cystoscopy showed bilateral ureteral jets. No stitches were visualized in the bladder during cystoscopy. All trocars were removed under direct visualization, and the abdomen was desufflated. The fascial incision of the umbilicus and 45-WU site were closed with a 0 Vicryl  figure of eight stitch. All skin incisions were closed with 4-0 Vicryl subcuticular stitches and Dermabond. The patient tolerated the procedures well. All instruments, needles, and  sponge counts were correct x 2. The patient was taken to the recovery room awake, extubated and in stable condition.    Verita Schneiders, MD, [place]  Attending [place], [place]

## 2014-03-04 ENCOUNTER — Encounter (HOSPITAL_COMMUNITY): Payer: Self-pay | Admitting: Obstetrics & Gynecology

## 2014-03-04 LAB — CBC
HCT: 29.9 % — ABNORMAL LOW (ref 36.0–46.0)
Hemoglobin: 9.5 g/dL — ABNORMAL LOW (ref 12.0–15.0)
MCH: 27.9 pg (ref 26.0–34.0)
MCHC: 31.8 g/dL (ref 30.0–36.0)
MCV: 87.9 fL (ref 78.0–100.0)
PLATELETS: 214 10*3/uL (ref 150–400)
RBC: 3.4 MIL/uL — ABNORMAL LOW (ref 3.87–5.11)
RDW: 12.7 % (ref 11.5–15.5)
WBC: 16.7 10*3/uL — AB (ref 4.0–10.5)

## 2014-03-04 MED ORDER — ONDANSETRON HCL 4 MG PO TABS: 4.0000 mg | ORAL_TABLET | Freq: Four times a day (QID) | ORAL | Status: DC | PRN

## 2014-03-04 MED ORDER — PROMETHAZINE HCL 25 MG PO TABS: 25.0000 mg | ORAL_TABLET | Freq: Four times a day (QID) | ORAL | Status: DC | PRN

## 2014-03-04 NOTE — Discharge Summary (Signed)
Gynecology Physician Discharge Summary  Patient ID: Pamela Mcfarland MRN: [id number] DOB/AGE: 1976-09-13 38 y.o.  Admit date: 03/03/2014 Discharge date: 03/04/2014  Preoperative Diagnoses: Fibroids, menorrhagia  Procedures: Procedure(s) (LRB): LAPAROSCOPIC BILATERAL SALPINGECTOMY (Bilateral) HYSTERECTOMY TOTAL LAPAROSCOPIC (N/A) CYSTOSCOPY (N/A)  Results for orders placed during the hospital encounter of 03/03/14 (from the past 336 hour(s))  CBC   Collection Time    03/03/14  7:45 AM      Result Value Ref Range   WBC 10.2  4.0 - 10.5 K/uL   RBC 3.96  3.87 - 5.11 MIL/uL   Hemoglobin 11.4 (*) 12.0 - 15.0 g/dL   HCT 35.2 (*) 36.0 - 46.0 %   MCV 88.9  78.0 - 100.0 fL   MCH 28.8  26.0 - 34.0 pg   MCHC 32.4  30.0 - 36.0 g/dL   RDW 12.7  11.5 - 15.5 %   Platelets 235  150 - 400 K/uL  TYPE AND SCREEN   Collection Time    03/03/14  7:45 AM      Result Value Ref Range   ABO/RH(D) O POS     Antibody Screen NEG     Sample Expiration 03/06/2014    ABO/RH   Collection Time    03/03/14  7:45 AM      Result Value Ref Range   ABO/RH(D) O POS    PREGNANCY, URINE   Collection Time    03/03/14  7:55 AM      Result Value Ref Range   Preg Test, Ur NEGATIVE  NEGATIVE  CBC   Collection Time    03/04/14  5:12 AM      Result Value Ref Range   WBC 16.7 (*) 4.0 - 10.5 K/uL   RBC 3.40 (*) 3.87 - 5.11 MIL/uL   Hemoglobin 9.5 (*) 12.0 - 15.0 g/dL   HCT 29.9 (*) 36.0 - 46.0 %   MCV 87.9  78.0 - 100.0 fL   MCH 27.9  26.0 - 34.0 pg   MCHC 31.8  30.0 - 36.0 g/dL   RDW 12.7  11.5 - 15.5 %   Platelets 214  150 - 400 K/uL    Hospital Course:  Pamela Mcfarland is a 38 y.o. [id number]  admitted for scheduled surgery.  She underwent the procedures as mentioned above, her operation was uncomplicated. For further details about surgery, please refer to the operative report. Patient had an uncomplicated postoperative course. By time of discharge on POD#1, her pain was controlled on oral pain  medications; she was ambulating, voiding without difficulty, tolerating regular diet and passing flatus. She was deemed stable for discharge to home.   Discharge Exam: Blood pressure 108/66, pulse 84, temperature 98.3 F (36.8 C), temperature source Oral, resp. rate 18, height 5\' 4"  (1.626 m), weight 182 lb (82.555 kg), SpO2 99.00%. General appearance: alert and no distress  Resp: clear to auscultation bilaterally  Cardio: RRR  GI: soft, non-tender; bowel sounds normal; no masses, no organomegaly.  Incisions: C/D/I, no erythema, no drainage noted Pelvic: scant blood on pad  Extremities: extremities normal, atraumatic, no cyanosis or edema and Homans sign is negative, no sign of DVT  Discharged Condition: Stable  Disposition: Home    Medication List         docusate sodium 100 MG capsule  Commonly known as:  COLACE  Take 1 capsule (100 mg total) by mouth 2 (two) times daily as needed.     ibuprofen 600 MG tablet  Commonly known as:  ADVIL,MOTRIN  Take 1 tablet (600 mg total) by mouth every 6 (six) hours as needed.     oxyCODONE-acetaminophen 5-325 MG per tablet  Commonly known as:  PERCOCET/ROXICET  Take 1-2 tablets by mouth every 6 (six) hours as needed.           Follow-up Information   Follow up with [facility] A, MD On 04/14/2014. (3:00 pm for postoperative appointment. Call clinic/come to [facility]U for postoperative issues)    Specialty:  Obstetrics and Gynecology   Contact information:   [place] [address] [phone]       Signed:  Verita Schneiders, MD, [place] Attending [place], [place]

## 2014-03-04 NOTE — Progress Notes (Signed)
Pt is discharged in the care of husband,with R>N. Escort. Denies any pain or discomfort.Discharge instructions  With Rx were given to pt with good understanding. No equipment needed for home use. Abd . Lapsites are clean and dry.. Denies heavy vaginal bleeding.

## 2014-03-05 ENCOUNTER — Telehealth: Payer: Self-pay | Admitting: *Deleted

## 2014-03-05 NOTE — Telephone Encounter (Signed)
Message copied by Samuel Germany on Wed Mar 05, 2014  3:07 PM ------      Message from: Verita Schneiders A      Created: Tue Mar 04, 2014  4:47 PM       Benign surgical pathology. Please call to inform patient of results.       ------

## 2014-03-06 NOTE — Telephone Encounter (Signed)
Called patient with [facility] [phone], no answer, left message that we are attempting to reach her and for her to call back to the office.

## 2014-03-07 NOTE — Telephone Encounter (Signed)
Patient called and left message stating that she is calling us back for results. Called patient and informed her of normal results. Patient verbalized understanding and had no further questions

## 2014-04-14 ENCOUNTER — Ambulatory Visit: Payer: No Typology Code available for payment source | Admitting: Obstetrics & Gynecology

## 2014-05-15 ENCOUNTER — Ambulatory Visit: Payer: No Typology Code available for payment source | Admitting: Obstetrics & Gynecology

## 2014-05-15 ENCOUNTER — Telehealth: Payer: Self-pay | Admitting: General Practice

## 2014-05-15 ENCOUNTER — Encounter: Payer: Self-pay | Admitting: General Practice

## 2014-05-15 NOTE — Telephone Encounter (Signed)
Patient no showed for appt today. Called patient at only listed number and number was invalid. Called patient at emergency contact number and number was invalid as well. Will send letter.

## 2014-07-07 ENCOUNTER — Encounter: Payer: Self-pay | Admitting: Obstetrics & Gynecology

## 2014-07-07 ENCOUNTER — Ambulatory Visit (INDEPENDENT_AMBULATORY_CARE_PROVIDER_SITE_OTHER): Payer: No Typology Code available for payment source | Admitting: Obstetrics & Gynecology

## 2014-07-07 VITALS — BP 105/74 | HR 82 | Temp 98.9°F | Ht 65.0 in | Wt 182.3 lb

## 2014-07-07 DIAGNOSIS — Z09 Encounter for follow-up examination after completed treatment for conditions other than malignant neoplasm: Secondary | ICD-10-CM

## 2014-07-07 NOTE — Patient Instructions (Signed)
Return to clinic for any obstetric concerns or go to [facility]U for evaluation  

## 2014-07-07 NOTE — Progress Notes (Signed)
   CLINIC ENCOUNTER NOTE  History:  38 y.o. [id number] here today for postoperative check.  She underwent total laparoscopic hysterectomy, bilateral salpingectomy, cystoscopy on 03/03/14 but did not follow up for her scheduled appointment. She reports having an episode of vaginitis and post-coital bleeding a few months ago, no symptoms currently. No other symptoms.     The following portions of the patient's history were reviewed and updated as appropriate: allergies, current medications, past family history, past medical history, past social history, past surgical history and problem list.  Review of Systems:  Pertinent items are noted in HPI.  Objective:  Physical Exam BP 105/74  Pulse 82  Temp(Src) 98.9 F (37.2 C) (Oral)  Ht 5\' 5"  (1.651 m)  Wt 182 lb 4.8 oz (82.691 kg)  BMI 30.34 kg/m2  LMP 02/11/2014 Gen: NAD Abd: Soft, nontender and nondistended Pelvic: Normal appearing external genitalia; normal appearing vaginal mucosa and well-healed cuff.  Normal discharge.    Pathology 03/03/14 Uterus and bilateral fallopian tubes, with cervix - ADENOMYOSIS. - ENDOMETRIUM: BENIGN SECRETORY ENDOMETRIUM, NO ATYPIA, HYPERPLASIA OR MALIGNANCY. - CERVIX: BENIGN SQUAMOUS MUCOSA AND ENDOCERVICAL MUCOSA, NO DYSPLASIA OR MALIGNANCY. - BILATERAL FALLOPIAN TUBES: BENIGN FALLOPIAN TUBAL TISSUE WITH ASSOCIATED ENDOMETRIOSIS, NO ATYPIA OR MALIGNANCY.  Assessment & Plan:  Normal postoperative check, patient reassured Routine preventative health maintenance measures emphasized.   Verita Schneiders, MD, [place] Attending [place] for [employer], [place]

## 2014-08-18 ENCOUNTER — Encounter: Payer: Self-pay | Admitting: Obstetrics & Gynecology

## 2015-03-01 IMAGING — US US TRANSVAGINAL NON-OB
1 series · 13 of 25 positions shown · non-contrast
Comparison: None

CLINICAL DATA: Menorrhagia.  Fibroids.  Followup left ovarian cyst.

EXAM:
TRANSABDOMINAL AND TRANSVAGINAL ULTRASOUND OF PELVIS
TECHNIQUE: Both transabdominal and transvaginal ultrasound examinations of the
pelvis were performed. Transabdominal technique was performed for
global imaging of the pelvis including uterus, ovaries, adnexal
regions, and pelvic cul-de-sac. It was necessary to proceed with
endovaginal exam following the transabdominal exam to visualize the
uterus, endometrium and ovaries to better advantage.

[Series 1: us pelvis complete · 62 acquisitions, 13 frames shown]
[im 1/62]
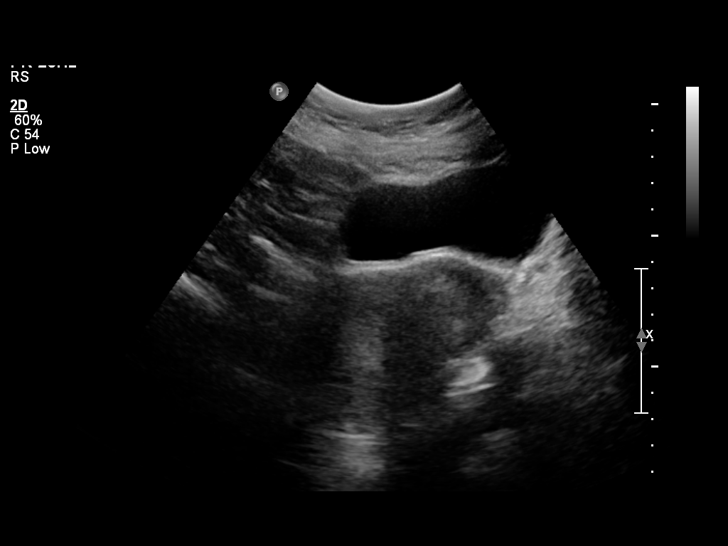
[im 6/62]
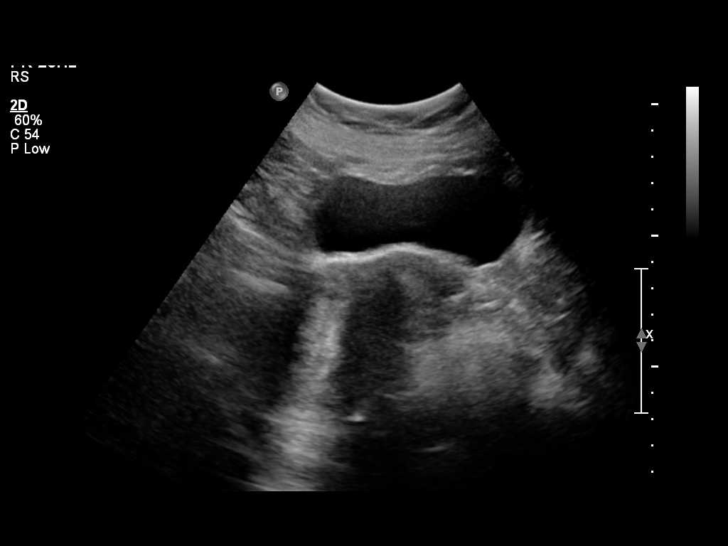
[im 11/62]
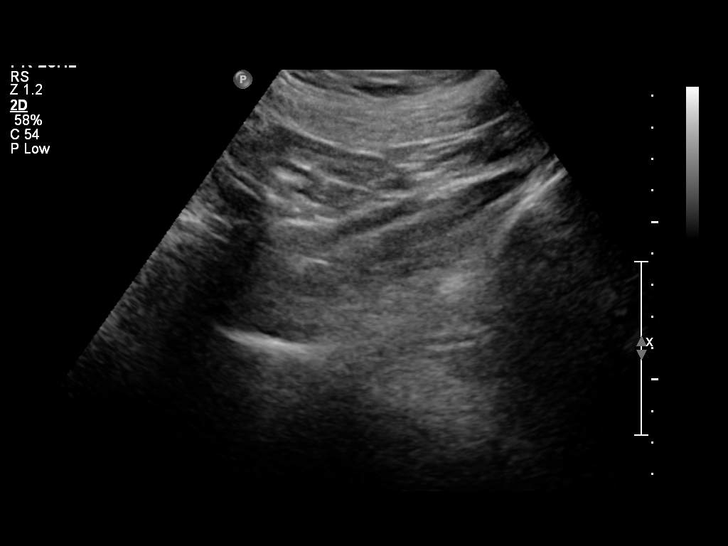
[im 16/62]
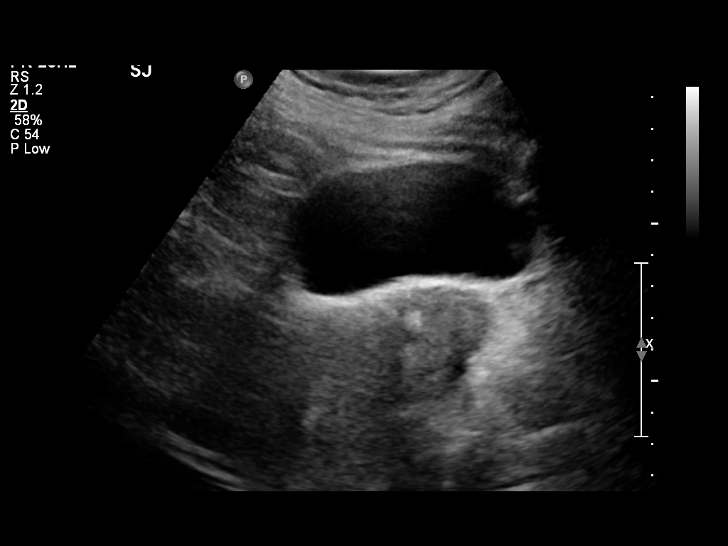
[im 21/62]
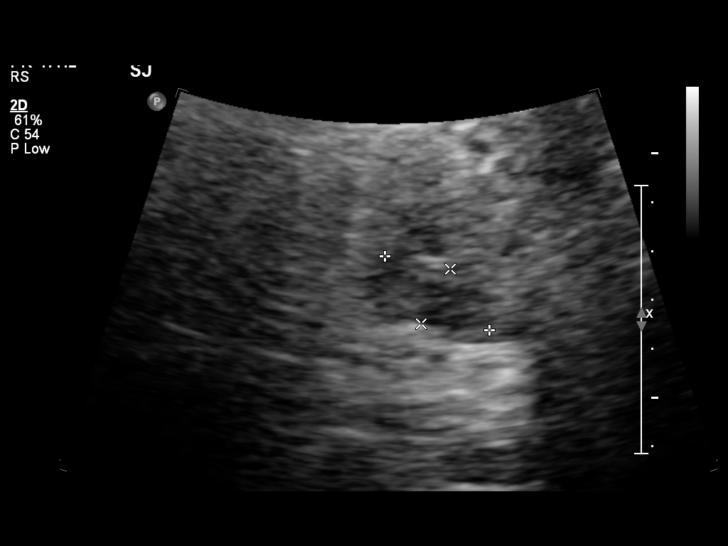
[im 26/62]
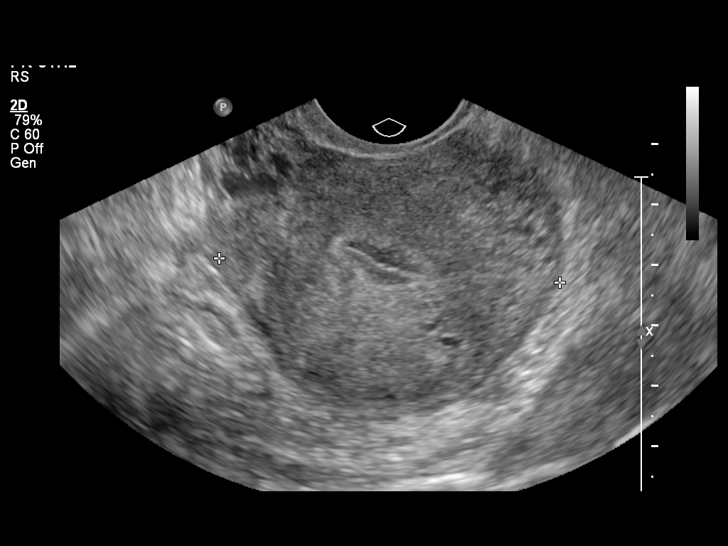
[im 31/62]
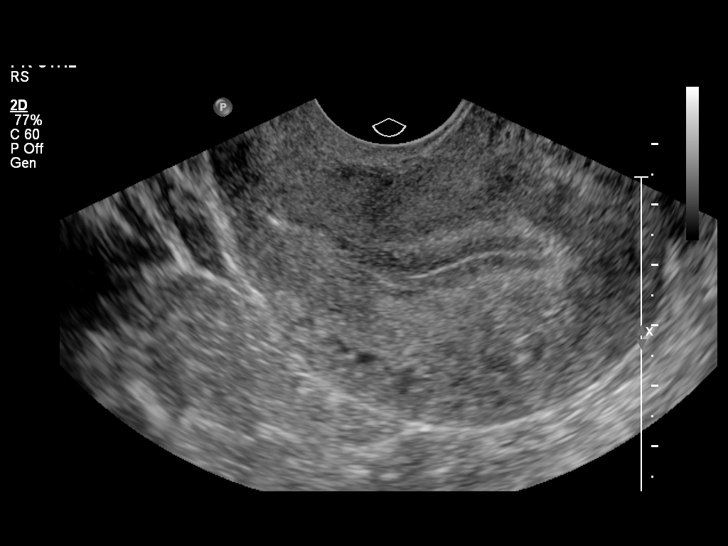
[im 36/62]
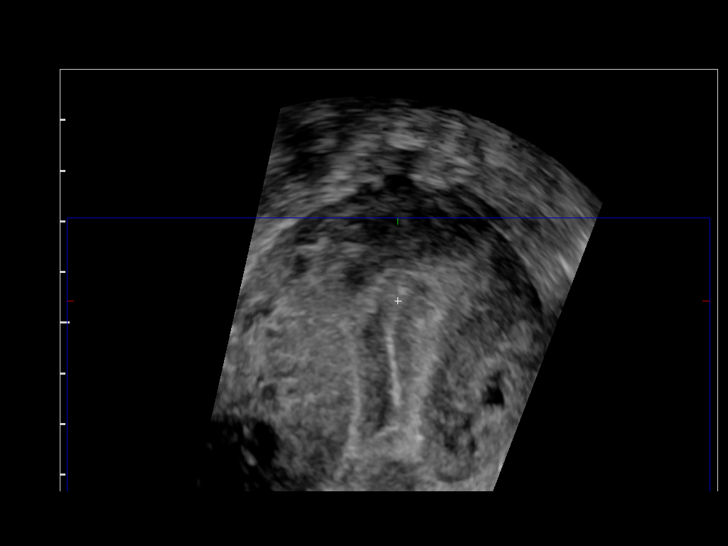
[im 41/62]
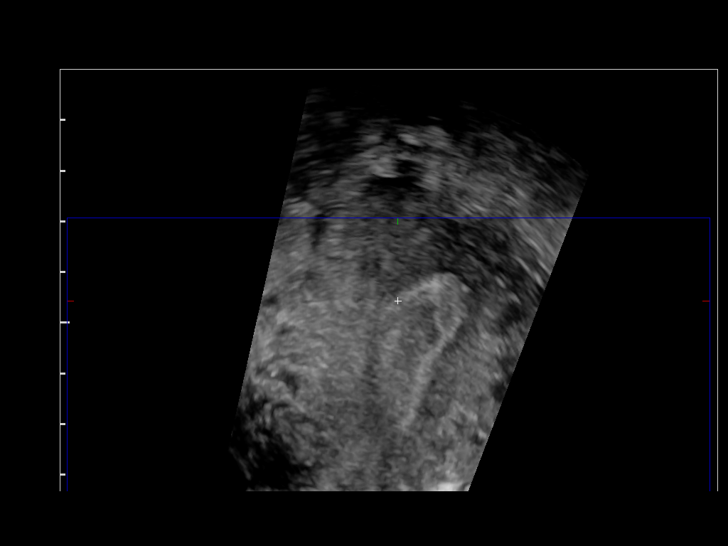
[im 46/62]
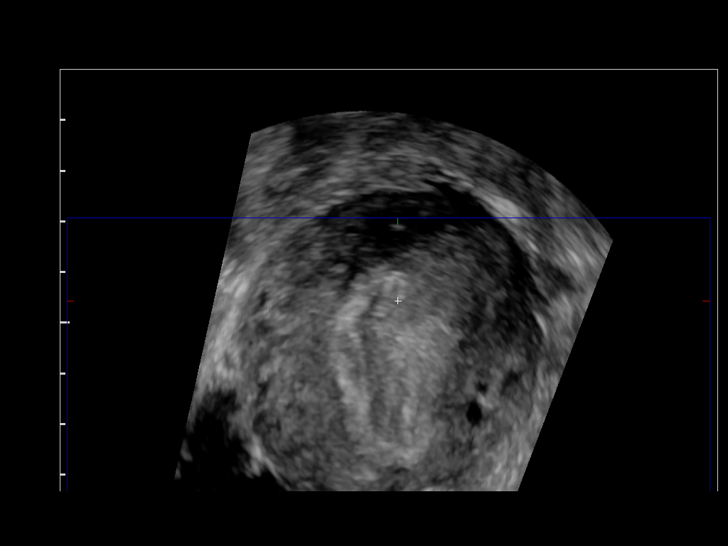
[im 51/62]
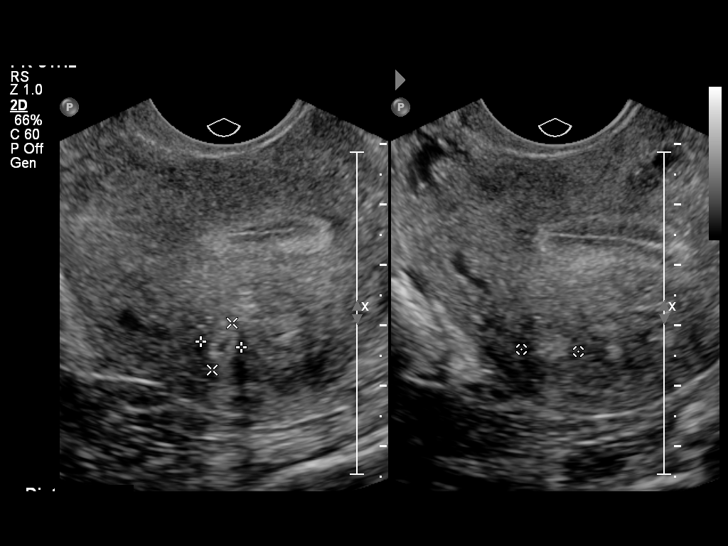
[im 56/62]
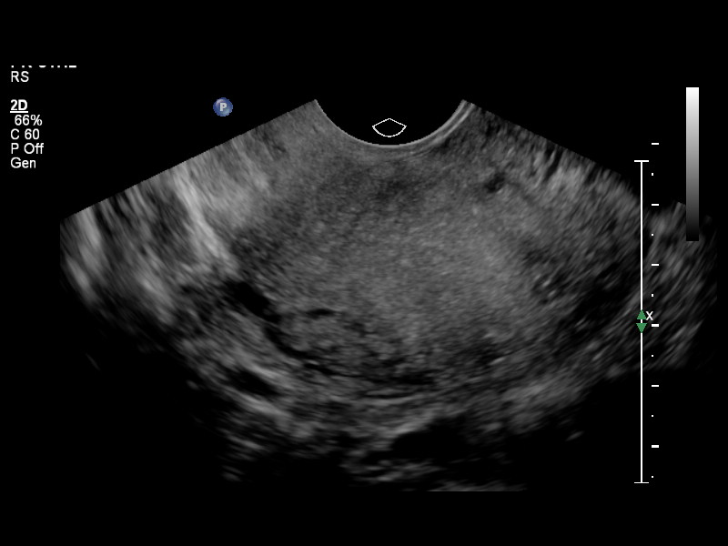
[im 62/62]
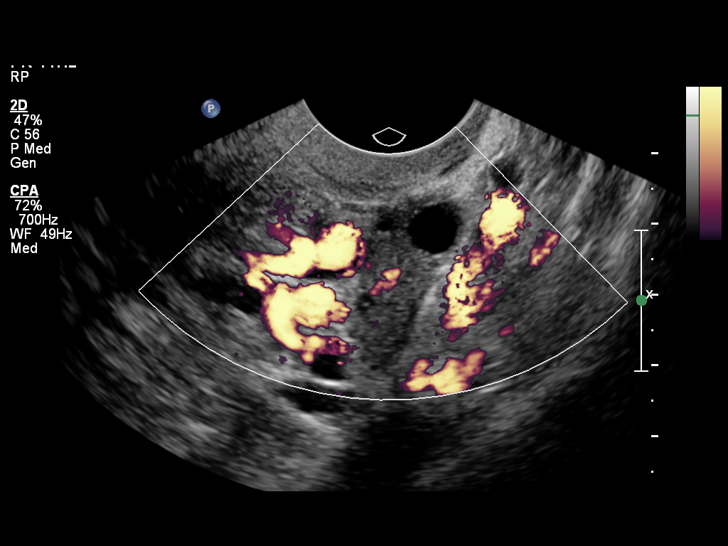

[13 of 25 positions shown; findings below may reference images not displayed]

FINDINGS: Uterus

Measurements: 7.8 cm x 5.6 cm x 5.6 cm. Uterus is retroverted. Two
small probable fibroids. The largest is nearly isoechoic measuring
1.3 cm, mural and location lying in the anterior right upper uterine
segment. Here there is more hypoechoic with a small cystic
component. It lies in the post upper uterine segment measuring 11 mm
in greatest dimension. A third probable fibroid lies in the anterior
upper uterine segment a mildly hyperechoic, measuring 9 mm in
greatest dimension. No other uterine masses.

Endometrium

Thickness: 9.1 mm. No endometrial mass minimal fluid seen in the
upper endometrial canal, nonspecific.

Right ovary

Measurements: 3.0 cm x 2.6 cm x 2.1 cm There are simple appearing
cysts followup consistent with physiologic follicular cysts. No
adnexal mass.

Left ovary

Measurements: 2.1 cm x 1.7 cm x 1.4 cm. Normal appearance/no adnexal
mass.

Other findings

No free fluid.
IMPRESSION: 1. Three small uterine fibroids, all mural and location. These do
not affect the endometrium. Uterus otherwise unremarkable.
2. Simple appearing, physiologic, right ovarian cysts. There is no
evidence of a complex cyst, which was reported from a previous study
dated 09/11/2013. These cysts do not need additional follow-up.
